# Patient Record
Sex: Male | Born: 1985 | Race: White | Hispanic: No | Marital: Single | Smoking: Current every day smoker
Health system: Southern US, Community
[De-identification: ages and names within clinical notes are randomized; demographics above are authoritative.]

## PROBLEM LIST (undated history)

## (undated) DIAGNOSIS — F309 Manic episode, unspecified: Secondary | ICD-10-CM

## (undated) DIAGNOSIS — R42 Dizziness and giddiness: Secondary | ICD-10-CM

## (undated) DIAGNOSIS — F329 Major depressive disorder, single episode, unspecified: Secondary | ICD-10-CM

## (undated) DIAGNOSIS — F32A Depression, unspecified: Secondary | ICD-10-CM

## (undated) DIAGNOSIS — F1310 Sedative, hypnotic or anxiolytic abuse, uncomplicated: Secondary | ICD-10-CM

## (undated) DIAGNOSIS — F419 Anxiety disorder, unspecified: Secondary | ICD-10-CM

## (undated) DIAGNOSIS — F1010 Alcohol abuse, uncomplicated: Secondary | ICD-10-CM

## (undated) DIAGNOSIS — I517 Cardiomegaly: Secondary | ICD-10-CM

## (undated) DIAGNOSIS — R490 Dysphonia: Secondary | ICD-10-CM

## (undated) HISTORY — DX: Cardiomegaly: I51.7

## (undated) HISTORY — DX: Manic episode, unspecified: F30.9

## (undated) HISTORY — DX: Anxiety disorder, unspecified: F41.9

## (undated) HISTORY — DX: Depression, unspecified: F32.A

## (undated) HISTORY — DX: Dysphonia: R49.0

## (undated) HISTORY — PX: NO PAST SURGERIES: SHX2092

## (undated) HISTORY — DX: Sedative, hypnotic or anxiolytic abuse, uncomplicated: F13.10

## (undated) HISTORY — DX: Major depressive disorder, single episode, unspecified: F32.9

## (undated) HISTORY — DX: Dizziness and giddiness: R42

## (undated) HISTORY — DX: Alcohol abuse, uncomplicated: F10.10

---
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
## 2004-11-20 ENCOUNTER — Ambulatory Visit: Payer: Self-pay | Admitting: Family Medicine

## 2005-09-30 ENCOUNTER — Ambulatory Visit: Payer: Self-pay | Admitting: Family Medicine

## 2005-11-23 ENCOUNTER — Ambulatory Visit: Payer: Self-pay | Admitting: Family Medicine

## 2010-04-26 ENCOUNTER — Emergency Department (HOSPITAL_COMMUNITY): Admission: EM | Admit: 2010-04-26 | Discharge: 2010-04-26 | Payer: Self-pay | Admitting: Family Medicine

## 2010-06-08 ENCOUNTER — Emergency Department (HOSPITAL_COMMUNITY): Admission: EM | Admit: 2010-06-08 | Discharge: 2010-06-08 | Payer: Self-pay | Admitting: Family Medicine

## 2010-10-08 LAB — CULTURE, ROUTINE-ABSCESS

## 2010-12-12 NOTE — H&P (Signed)
Curtis Nelson, Curtis Nelson                       ACCOUNT NO.:  [id number]   MEDICAL RECORD NO.:  [id number]                   PATIENT TYPE:  INP   LOCATION:  0348                                 FACILITY:  [facility]   PHYSICIAN:  Titus Dubin. Alwyn Ren, M.D. [facility]         DATE OF BIRTH:  [birth year]   DATE OF ADMISSION:  09/18/2002  DATE OF DISCHARGE:                                HISTORY & PHYSICAL   HISTORY OF PRESENT ILLNESS:  The patient is an 25 year old white male  admitted for observation status because of elevated acetaminophen levels in  the context of depression.   Apparently he had altercations with his significant-other girlfriend who  birthed his child three months ago.  Apparently he has been denied  visitation rights.  He states he was not trying to commit suicide but was  simply trying to get attention.   He has never been hospitalized but has been treated in the [facility] setting several years ago.   He had not been taking medications but has been using recreational drugs  including marijuana.  He also smokes one-half pack per day.  He denies  alcohol intake.   Dr. Milinda Antis had given him samples of Paxil XR 12.5 and Paxil XR 25 mg.  He  only took one or two pills according to his mother and, because it was not  an instant response, did not take the other samples.  Last night he took 28  of his Paxil XR 25 mg pills and two of the 12.5 pills.  He categorically  denies taking any Tylenol despite the elevated levels.   SOCIAL HISTORY:  He dropped out of high school two years ago.  He has held  no steady job during that period of time.  He has no long-term goals.  He  states he does plan to help support his child.   FAMILY HISTORY:  Positive for hypertension in both parents.  Mother had skin  cancer.  Father has had a history of alcoholism.  Maternal grandfather had  metastatic cancer, probably of lung primary.  His maternal aunt had mitral  valve  prolapse.  There is no family history of diabetes or stroke.   REVIEW OF SYSTEMS:  He states he has been depressed for two to three weeks.  He denies any other symptoms in reference to review of systems.   PHYSICAL EXAMINATION:  VITAL SIGNS:  Blood pressure has been as high as  162/76, O2 saturations are 98% on room air, pulse is 65 and regular,  respiratory rate is 20.  He is afebrile.  HEENT:  Pupils are dilated but responsive to light.  Dental hygiene is fair  to good.  He has no lymphadenopathy about the head, neck, or axilla.  Thyroid is normal to palpation.  HEART:  He has a grade 1 systolic murmur at the apex.  CHEST:  Clear to auscultation, with no increased work of  breathing.  ABDOMEN:  Flat and well muscled.  Without organomegaly or masses.  EXTREMITIES:  All pulses are intact.  There is no edema.  He has full range  of motion of extremities.  There is a tattoo resembling a marijuana leaf on  the left medial ankle and his initials on his right deltoid.  NEUROLOGIC:  Other than lethargy, there are no neurologic or  neuropsychiatric symptoms except for his obvious depression.   LABORATORY DATA:  Acetaminophen level is 223.  Drug screen was positive for  marijuana and amphetamines.  He denied taking any other medications except  for the Paxil and marijuana.  PT was 13 with an INR of 0.9.   PLAN:  He is now admitted with overdose which apparently was a suicide  gesture rather than a suicide attempt in the setting of depression.  The  excess acetaminophen level warrants continuation of the Mucomyst protocol.  The pharmacy will be consulted to try to discern whether indeed he did take  Tylenol or whether the acetaminophen level is representative of excess  ingestion.  The latter is more likely as he does not mention amphetamines  and yet they are positive on the drug screen as well.   Once there is deemed to be no hepatic or coagulopathy risk, consult by  psychiatry with  subsequent follow-up.  Long-term follow-up will be done by  Dr. Milinda Antis as an outpatient.                                               Titus Dubin. Alwyn Ren, M.D. [facility]    WFH/MEDQ  D:  09/18/2002  T:  09/18/2002  Job:  [id number]   cc:   Marne A. Milinda Antis, M.D. [facility]

## 2010-12-12 NOTE — Discharge Summary (Signed)
Curtis Nelson, Curtis Nelson                       ACCOUNT NO.:  [id number]   MEDICAL RECORD NO.:  [id number]                   PATIENT TYPE:  INP   LOCATION:  0348                                 FACILITY:  [facility]   PHYSICIAN:  Rene Paci, M.D. [facility]          DATE OF BIRTH:  [birth year]   DATE OF ADMISSION:  09/18/2002  DATE OF DISCHARGE:                                 DISCHARGE SUMMARY   DISCHARGE DIAGNOSES:  1. Intentional drug overdose with toxic acetaminophen levels, status post     Mucomyst protocol, resolved.  2. Suicide attempt.  3. Abnormal thyroid function tests, question early hyperthyroidism.   DISCHARGE MEDICATIONS:  As  prior to  admission and include Paxil XR 12.5 mg  p.o. b.i.d.   FOLLOW UP:  As per ACT team to be arranged with [facility]. He  is also to call  his primary care physician, Dr. Vinnie Langton, for repeat  TFTs in approximately 6 to 8 weeks after discharge to monitor for early  hyperthyroidism.   CONDITION ON DISCHARGE:  Medically clear and improved.   DISPOSITION:  As to be decided by ACT team, either to home or inpatient  Behavioral Health.   HOSPITAL COURSE BY PROBLEM:  PROBLEM #1, INTENTIONAL OVERDOSE WITH SUICIDE  ATTEMPT:  The patient is an 25 year old white gentleman admitted for  observation after an intentional drug overdose. He reports that he had been  fighting with his girlfriend, mother of his 27-year-old daughter, and was  trying to get attention from her and his mother. He reported taking 28 of  his Paxil tablets and unsure what other medications he might have taken. He  specifically denied taking Tylenol, however, his acetaminophen level was  greater than 220 on admission. His urine drug screen was also positive for  marijuana as well as amphetamines. The patient could not recall having  taken any marijuana or amphetamines either.   He was admitted to a telemetry bed and begun on a Mucomyst protocol for  acetaminophen  ingestion. His coags and LFTs were normal at the time of  admission. Then 24 hours later on a repeat examination his LFTs remained  normal as did his coags. His acetaminophen level had resolved to less than  10.   We are still awaiting an ACT team evaluation to verify his mental stability  at this time. Per ACT team evaluation he will either be discharged to home  or [facility] for further inpatient treatment. At this time he  is medically stable and clear for discharge.   PROBLEM #2, ABNORMAL THYROID FUNCTION TESTS: It is unclear why TFTs were  checked during this hospitalization as the patient has no clear history of  previous thyroid problems. However, his TSH was high normal at 0.93, but his  free T4 was elevated twice normal at 2.38. This could represent early  hypothyroidism with an as of  yet unsuppressed TSH. However, the patient is asymptomatic  of any  hyperthyroid disease including normotensive and a pulse of 69. He denies any  feelings of anxiety. Accordingly we will not initiate treatment for  hyperthyroidism at this time and recommend followup of his TFTs with his  primary care physician in 6 to 8 weeks.                                                Rene Paci, M.D. [facility]    VL/MEDQ  D:  09/19/2002  T:  09/19/2002  Job:  [id number]

## 2012-06-21 ENCOUNTER — Ambulatory Visit: Payer: Self-pay | Admitting: Family Medicine

## 2012-06-21 VITALS — BP 130/70 | HR 80 | Temp 97.6°F | Resp 18 | Ht 77.0 in | Wt 193.0 lb

## 2012-06-21 DIAGNOSIS — L03119 Cellulitis of unspecified part of limb: Secondary | ICD-10-CM

## 2012-06-21 DIAGNOSIS — L02519 Cutaneous abscess of unspecified hand: Secondary | ICD-10-CM

## 2012-06-21 DIAGNOSIS — L853 Xerosis cutis: Secondary | ICD-10-CM

## 2012-06-21 DIAGNOSIS — L738 Other specified follicular disorders: Secondary | ICD-10-CM

## 2012-06-21 MED ORDER — DOXYCYCLINE HYCLATE 100 MG PO TABS: 100.0000 mg | ORAL_TABLET | Freq: Two times a day (BID) | ORAL | Status: DC

## 2012-06-21 MED ORDER — MUPIROCIN 2 % EX OINT: TOPICAL_OINTMENT | Freq: Three times a day (TID) | CUTANEOUS | Status: DC

## 2012-06-21 NOTE — Progress Notes (Signed)
   Subjective:    Patient ID: Curtis Nelson, male    DOB: 1985-11-27, 26 y.o.   MRN: [id number]  HPI Curtis Nelson is a 26 y.o. male  R 3rd finger swollen and warm - noticed this am, but sore underneath yesterday. Cracking in hands frequently, worse in winter.  Occasional lotion at night.  No fever.    Tx: neosporin.   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   Review of Systems  Constitutional: Negative for fever and chills.  Skin: Positive for color change and rash.       Objective:   Physical Exam  Constitutional: He is oriented to person, place, and time. He appears well-developed and well-nourished.  Pulmonary/Chest: Effort normal.  Musculoskeletal:       Right hand: He exhibits decreased range of motion (slight decreased ext at R3rd MCP with sts, but full ext and strength. ). He exhibits no bony tenderness.       Hands:      Diffuse dry skin with areas of cracked skin - dorsal hands bilaterally - especially over extensor surfaces of knuckles.   Neurological: He is alert and oriented to person, place, and time.          Assessment & Plan:  Curtis Nelson is a 26 y.o. male 1. Cellulitis of hand  doxycycline (VIBRA-TABS) 100 MG tablet, mupirocin ointment (BACTROBAN) 2 %  2. Dry skin     Discussed, demonstrated lotion application technique,  Including how to apply to back of hand and knuckles and flexor creases without greasy feel on fingertips.   Doxycycline and bactroban to r 3rd finger for secondary cellulitis. rtc precautions and care as below.  Understanding expressed.   Patient Instructions  Start antibiotic twice per day.  Also apply the antibiotic three times per day.  Warm compresses to finger at least three times per day, and recheck if not improving in next few days. Return to the clinic or go to the nearest emergency room if any of your symptoms worsen or new symptoms occur.  For the dry skin - apply lotion after washing hands.  Gloves if needed in  dusty/dry conditions. Aveeno, eucerin, lubriderm, or vaseline are all good options. Apply this multiple times per day.

## 2012-06-21 NOTE — Patient Instructions (Signed)
Start antibiotic twice per day.  Also apply the antibiotic three times per day.  Warm compresses to finger at least three times per day, and recheck if not improving in next few days. Return to the clinic or go to the nearest emergency room if any of your symptoms worsen or new symptoms occur.  For the dry skin - apply lotion after washing hands.  Gloves if needed in dusty/dry conditions. Aveeno, eucerin, lubriderm, or vaseline are all good options. Apply this multiple times per day.

## 2012-09-28 ENCOUNTER — Ambulatory Visit: Payer: Self-pay | Admitting: Psychology

## 2013-10-24 ENCOUNTER — Ambulatory Visit: Payer: 59

## 2013-10-24 ENCOUNTER — Ambulatory Visit (HOSPITAL_COMMUNITY)
Admission: RE | Admit: 2013-10-24 | Discharge: 2013-10-24 | Disposition: A | Discharge status: Home / Self Care | Payer: 59 | Source: Ambulatory Visit | Attending: Emergency Medicine | Admitting: Emergency Medicine

## 2013-10-24 ENCOUNTER — Ambulatory Visit: Payer: 59 | Admitting: Emergency Medicine

## 2013-10-24 VITALS — BP 132/64 | HR 72 | Temp 97.3°F | Resp 16 | Ht 76.0 in | Wt 203.6 lb

## 2013-10-24 DIAGNOSIS — R3129 Other microscopic hematuria: Secondary | ICD-10-CM | POA: Insufficient documentation

## 2013-10-24 DIAGNOSIS — R109 Unspecified abdominal pain: Secondary | ICD-10-CM

## 2013-10-24 DIAGNOSIS — R319 Hematuria, unspecified: Secondary | ICD-10-CM

## 2013-10-24 DIAGNOSIS — R1032 Left lower quadrant pain: Secondary | ICD-10-CM | POA: Insufficient documentation

## 2013-10-24 DIAGNOSIS — K5730 Diverticulosis of large intestine without perforation or abscess without bleeding: Secondary | ICD-10-CM | POA: Insufficient documentation

## 2013-10-24 LAB — POCT UA - MICROSCOPIC ONLY
BACTERIA, U MICROSCOPIC: NEGATIVE
CRYSTALS, UR, HPF, POC: NEGATIVE
Casts, Ur, LPF, POC: NEGATIVE
Yeast, UA: NEGATIVE

## 2013-10-24 LAB — POCT CBC
Granulocyte percent: 73.7 %G (ref 37–80)
HCT, POC: 43.5 % (ref 43.5–53.7)
Hemoglobin: 13.8 g/dL — AB (ref 14.1–18.1)
Lymph, poc: 1.7 (ref 0.6–3.4)
MCH: 30.9 pg (ref 27–31.2)
MCHC: 31.7 g/dL — AB (ref 31.8–35.4)
MCV: 97.5 fL — AB (ref 80–97)
MID (CBC): 0.5 (ref 0–0.9)
MPV: 9.4 fL (ref 0–99.8)
PLATELET COUNT, POC: 237 10*3/uL (ref 142–424)
POC Granulocyte: 6 (ref 2–6.9)
POC LYMPH %: 20.5 % (ref 10–50)
POC MID %: 5.8 % (ref 0–12)
RBC: 4.46 M/uL — AB (ref 4.69–6.13)
RDW, POC: 13.4 %
WBC: 8.1 10*3/uL (ref 4.6–10.2)

## 2013-10-24 LAB — POCT URINALYSIS DIPSTICK
BILIRUBIN UA: NEGATIVE
Glucose, UA: NEGATIVE
KETONES UA: NEGATIVE
LEUKOCYTES UA: NEGATIVE
Nitrite, UA: NEGATIVE
Urobilinogen, UA: 0.2
pH, UA: 6

## 2013-10-24 MED ORDER — IOHEXOL 300 MG/ML  SOLN
100.0000 mL | Freq: Once | INTRAMUSCULAR | Status: AC | PRN
  Administered 2013-10-24: 100 mL via INTRAVENOUS

## 2013-10-24 MED ORDER — IOHEXOL 300 MG/ML  SOLN
50.0000 mL | Freq: Once | INTRAMUSCULAR | Status: AC | PRN
  Administered 2013-10-24: 50 mL via ORAL

## 2013-10-24 NOTE — Patient Instructions (Signed)
Go to [facility] and register in Radiology

## 2013-10-24 NOTE — Progress Notes (Signed)
[facility] [address], [place] [id number] [phone]  Date:  10/24/2013   Name:  Curtis Nelson   DOB:  [birth year]   MRN:  [id number]  PCP:  No primary provider on file.    Chief Complaint: Abdominal Pain   History of Present Illness:  Curtis Nelson is a 28 y.o. very pleasant male patient who presents with the following:  Awoke with left periumbilical pain this morning.  Has not moved.  Constant and worsening.  No nausea or vomiting.  No stool change.  Anorectic.  No fever or chills.  No history of abdominal surgery.  No dysuria, urgency or frequency.  No blood in urine   Worse with bending over.  Uncomfortable while driving and hit bumps.  Less when lays still.  No improvement with over the counter medications or other home remedies. Denies other complaint or health concern today.   There are no active problems to display for this patient.   History reviewed. No pertinent past medical history.  History reviewed. No pertinent past surgical history.  History  Substance Use Topics  . Smoking status: Current Every Day Smoker  . Smokeless tobacco: Not on file  . Alcohol Use: Yes     Comment: rare    Family History  Problem Relation Age of Onset  . Heart disease Father     No Known Allergies  Medication list has been reviewed and updated.  Current Outpatient Prescriptions on File Prior to Visit  Medication Sig Dispense Refill  . doxycycline (VIBRA-TABS) 100 MG tablet Take 1 tablet (100 mg total) by mouth 2 (two) times daily.  20 tablet  0  . mupirocin ointment (BACTROBAN) 2 % Apply topically 3 (three) times daily.  22 g  0   No current facility-administered medications on file prior to visit.    Review of Systems:  As per HPI, otherwise negative.    Physical Examination: Filed Vitals:   10/24/13 1342  BP: 132/64  Pulse: 72  Temp: 97.3 F (36.3 C)  Resp: 16   Filed Vitals:   10/24/13 1342  Height: 6\' 4"  (1.93 m)  Weight:  203 lb 9.6 oz (92.352 kg)   Body mass index is 24.79 kg/(m^2). Ideal Body Weight: Weight in (lb) to have BMI = 25: 205  GEN: WDWN, NAD, Non-toxic, A & O x 3 HEENT: Atraumatic, Normocephalic. Neck supple. No masses, No LAD. Ears and Nose: No external deformity. CV: RRR, No M/G/R. No JVD. No thrill. No extra heart sounds. PULM: CTA B, no wheezes, crackles, rhonchi. No retractions. No resp. distress. No accessory muscle use. ABD: S, tender in LUQ and RLQ, ND, +BS. No rebound. No HSM.  Questionable jar tenderness EXTR: No c/c/e NEURO Normal gait.  PSYCH: Normally interactive. Conversant. Not depressed or anxious appearing.  Calm demeanor.    Assessment and Plan: Abdominal pain Hematuria Not characteristic for stone nor appendicitis CT  Signed,  Phillips OdorJeffery Anderson, MD   [facility] reading (PRIMARY) by  Dr. Dareen PianoAnderson.  No obstruction or free air  Results for orders placed in visit on 10/24/13  POCT UA - MICROSCOPIC ONLY      Result Value Ref Range   WBC, Ur, HPF, POC 0-1     RBC, urine, microscopic 8-12     Bacteria, U Microscopic neg     Mucus, UA large     Epithelial cells, urine per micros 0-1     Crystals, Ur, HPF, POC neg  Casts, Ur, LPF, POC neg     Yeast, UA neg    POCT URINALYSIS DIPSTICK      Result Value Ref Range   Color, UA yellow     Clarity, UA clear     Glucose, UA neg     Bilirubin, UA neg     Ketones, UA neg     Spec Grav, UA >=1.030     Blood, UA tr-lysed     pH, UA 6.0     Protein, UA trace     Urobilinogen, UA 0.2     Nitrite, UA neg     Leukocytes, UA Negative    POCT CBC      Result Value Ref Range   WBC 8.1  4.6 - 10.2 K/uL   Lymph, poc 1.7  0.6 - 3.4   POC LYMPH PERCENT 20.5  10 - 50 %L   MID (cbc) 0.5  0 - 0.9   POC MID % 5.8  0 - 12 %M   POC Granulocyte 6.0  2 - 6.9   Granulocyte percent 73.7  37 - 80 %G   RBC 4.46 (*) 4.69 - 6.13 M/uL   Hemoglobin 13.8 (*) 14.1 - 18.1 g/dL   HCT, POC 16.1  09.6 - 53.7 %   MCV 97.5 (*) 80 - 97 fL   MCH,  POC 30.9  27 - 31.2 pg   MCHC 31.7 (*) 31.8 - 35.4 g/dL   RDW, POC 04.5     Platelet Count, POC 237  142 - 424 K/uL   MPV 9.4  0 - 99.8 fL   .

## 2014-09-22 IMAGING — CT CT ABD-PELV W/ CM
2 of 4 series · 17 of 46 positions shown, 19 images · IV contrast (OMNIPAQUE)
Comparison: DG ABDOMEN ACUTE 2V W/ 1V CHEST dated 10/24/2013; CT
ABD-PELV WO/W CM dated 10/15/2009

CLINICAL DATA: Abdominal pain localizing to the left lower quadrant
with microscopic hematuria.

EXAM:
CT ABDOMEN AND PELVIS WITH CONTRAST
TECHNIQUE: Multidetector CT imaging of the abdomen and pelvis was performed
using the standard protocol following bolus administration of
intravenous contrast.
CONTRAST:  50mL OMNIPAQUE IOHEXOL 300 MG/ML SOLN, 100mL OMNIPAQUE
IOHEXOL 300 MG/ML SOLN

[Series 2: rtn a/p with · axial · 0.88mm/px · z∈[+1396,+1811]mm · 14 of 91 slices shown, 16 images]
[im 4/91  soft-tissue]
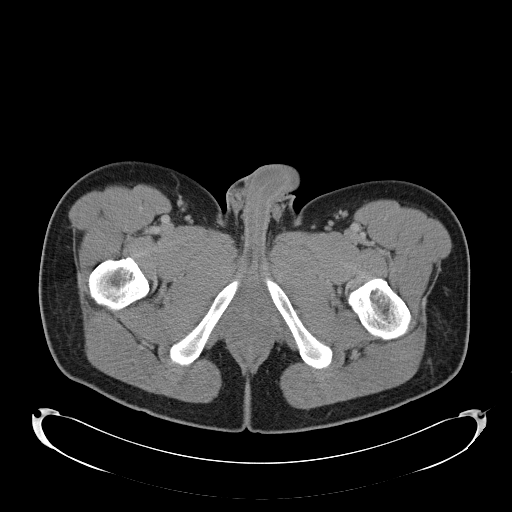
[im 4/91  bone]
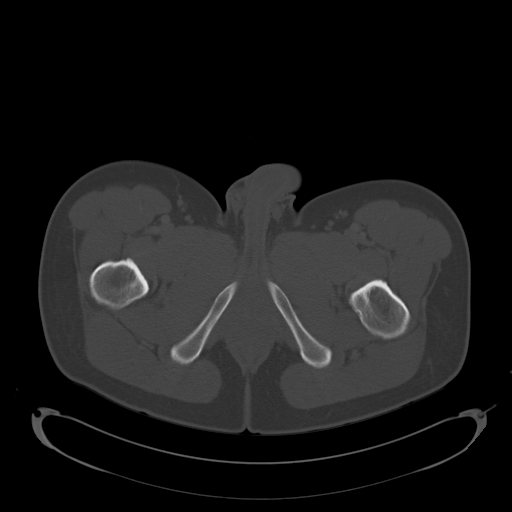
[im 11/91  soft-tissue]
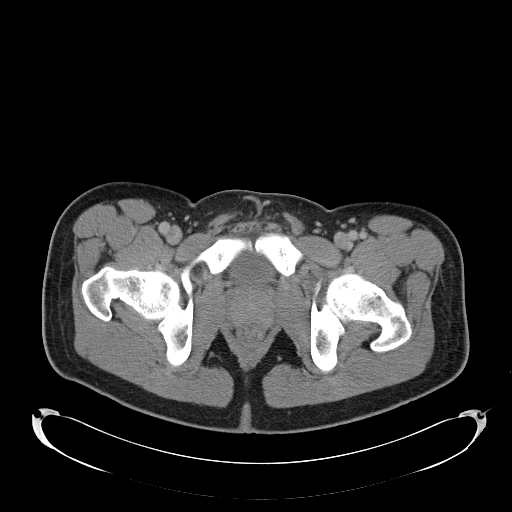
[im 19/91  soft-tissue]
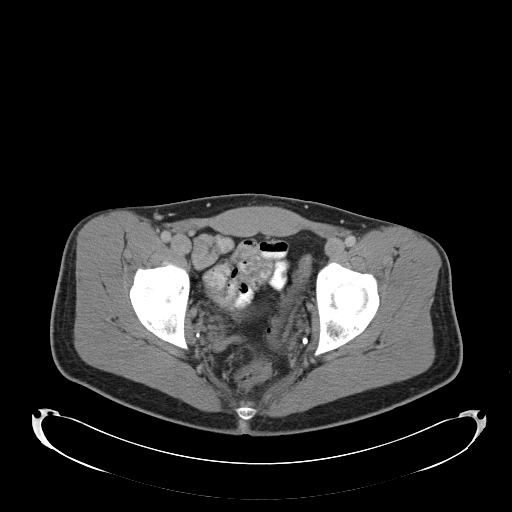
[im 26/91  soft-tissue]
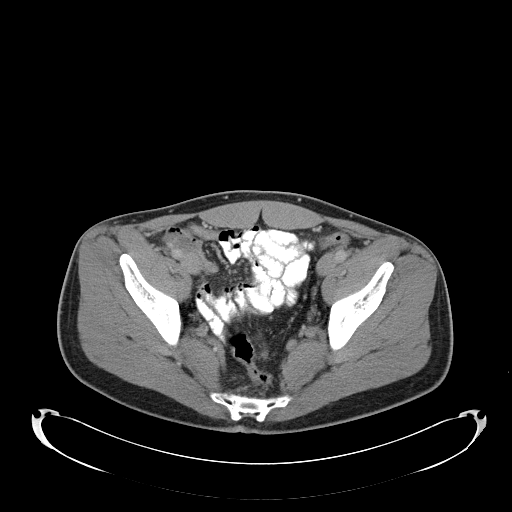
[im 29/91  soft-tissue]
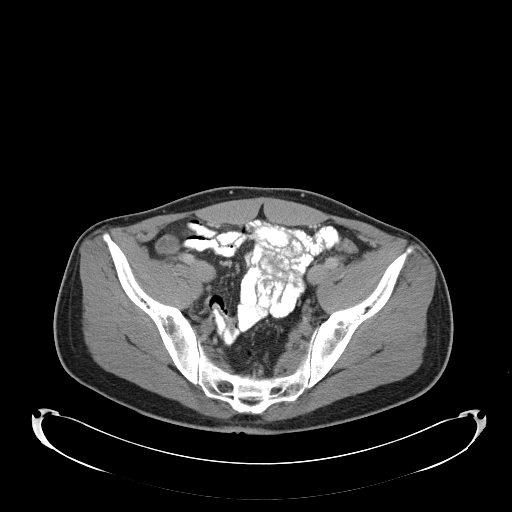
[im 37/91  soft-tissue]
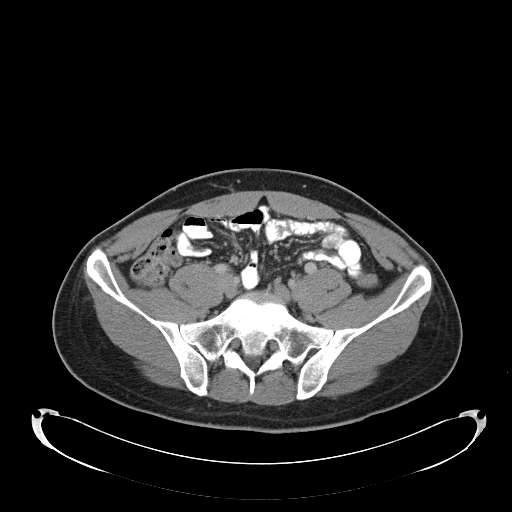
[im 44/91  soft-tissue]
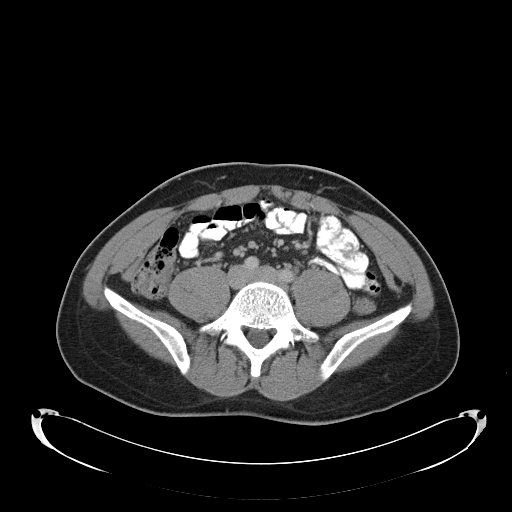
[im 47/91  soft-tissue]
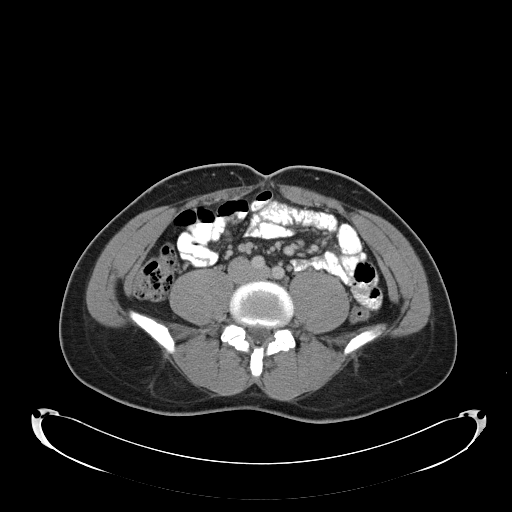
[im 55/91  soft-tissue]
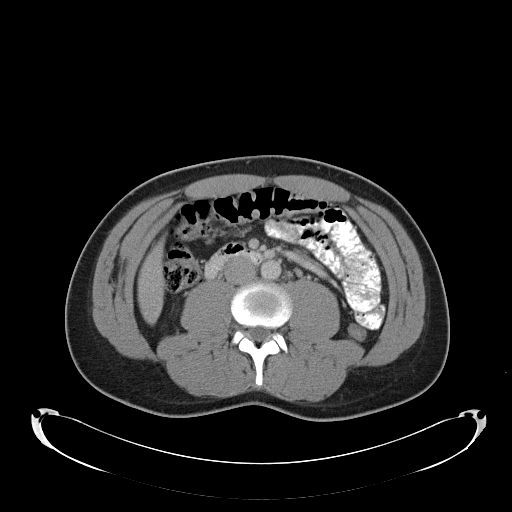
[im 55/91  bone]
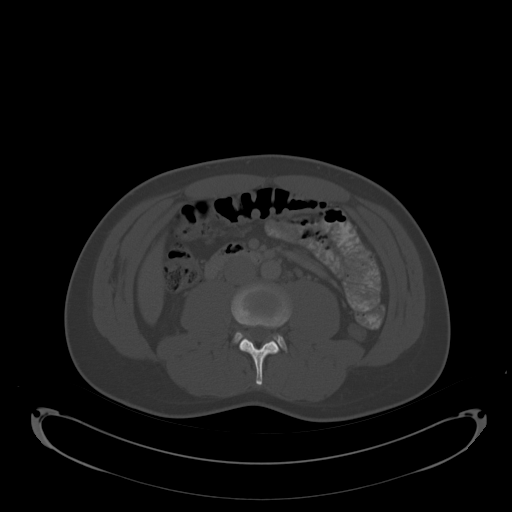
[im 62/91  soft-tissue]
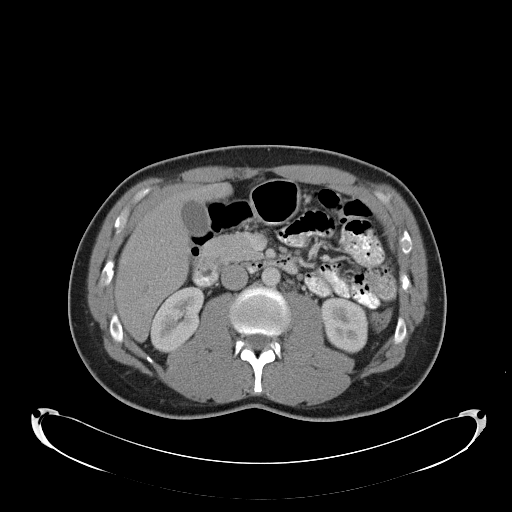
[im 69/91  soft-tissue]
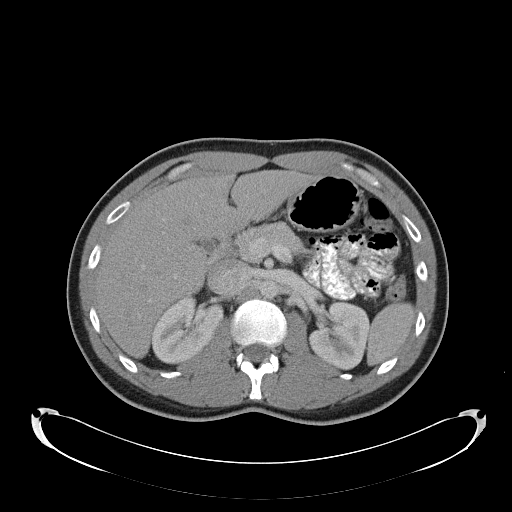
[im 73/91  soft-tissue]
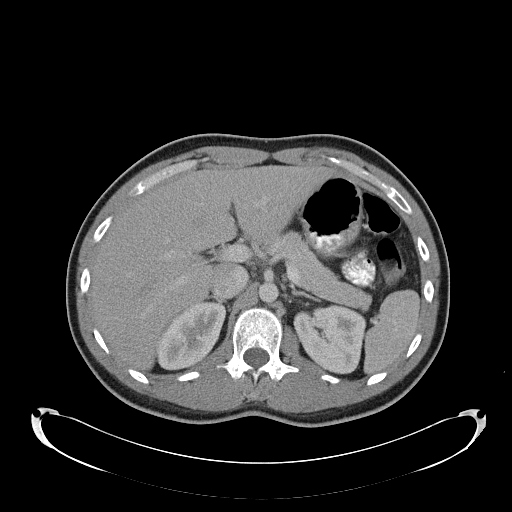
[im 80/91  soft-tissue]
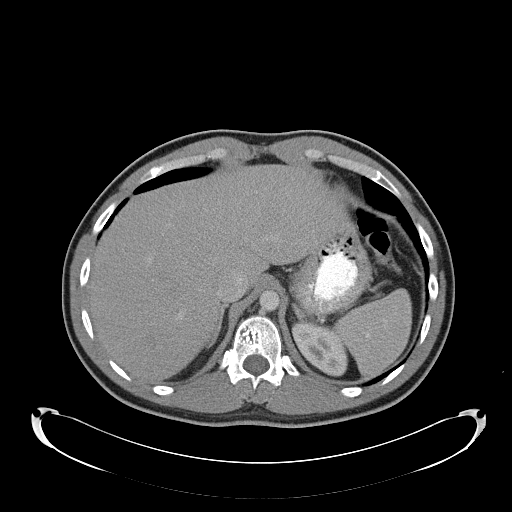
[im 87/91  soft-tissue]
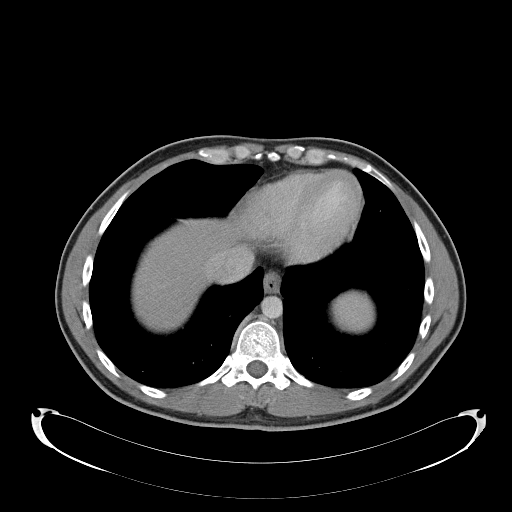

[Series 602: <mpr thick range> · coronal · 0.89mm/px · 3 of 78 slices shown]
[im 26/78  soft-tissue]
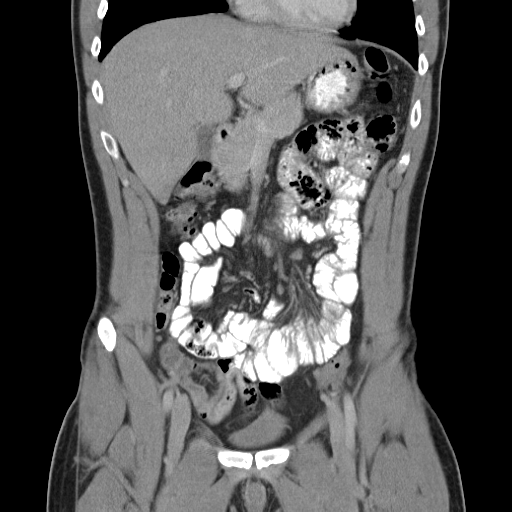
[im 35/78  soft-tissue]
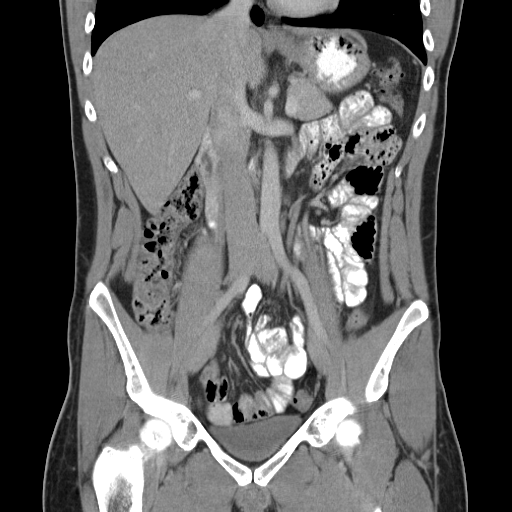
[im 43/78  soft-tissue]
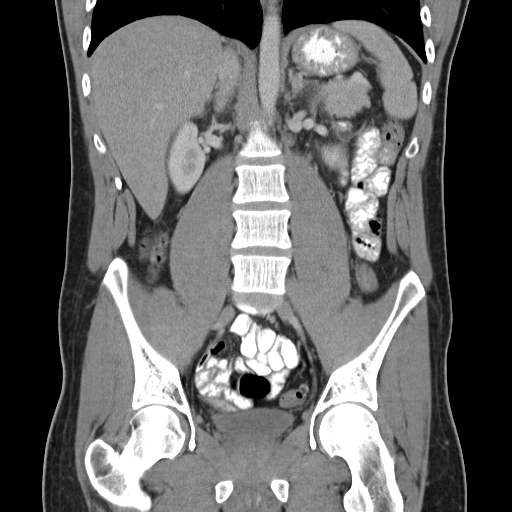

[17 of 46 positions shown; findings below may reference images not displayed]

FINDINGS: The kidneys, ureters and bladder are unremarkable with no evidence
of renal calculi, obstruction, ureteral calculi or bladder
abnormalities. The liver, gallbladder, pancreas, spleen, adrenal
glands and bowel are unremarkable. Mild diverticulosis of the
sigmoid colon noted without evidence of diverticulitis by CT.

No free fluid or abnormal fluid collections are seen. There is no
evidence of lymphadenopathy or focal mass. No vascular abnormalities
are identified. No hernias are seen. Bony structures are within
normal limits.
IMPRESSION: No acute findings in the abdomen or pelvis. No evidence of renal
calculi or other abnormality to explain microscopic hematuria.

## 2014-09-22 IMAGING — CR DG ABDOMEN ACUTE W/ 1V CHEST
3 series · 3 of 3 positions shown · non-contrast
Comparison: None.

CLINICAL DATA: Abdomen pain

EXAM:
ACUTE ABDOMEN SERIES (ABDOMEN 2 VIEW & CHEST 1 VIEW)

[AP (1 of 2)]
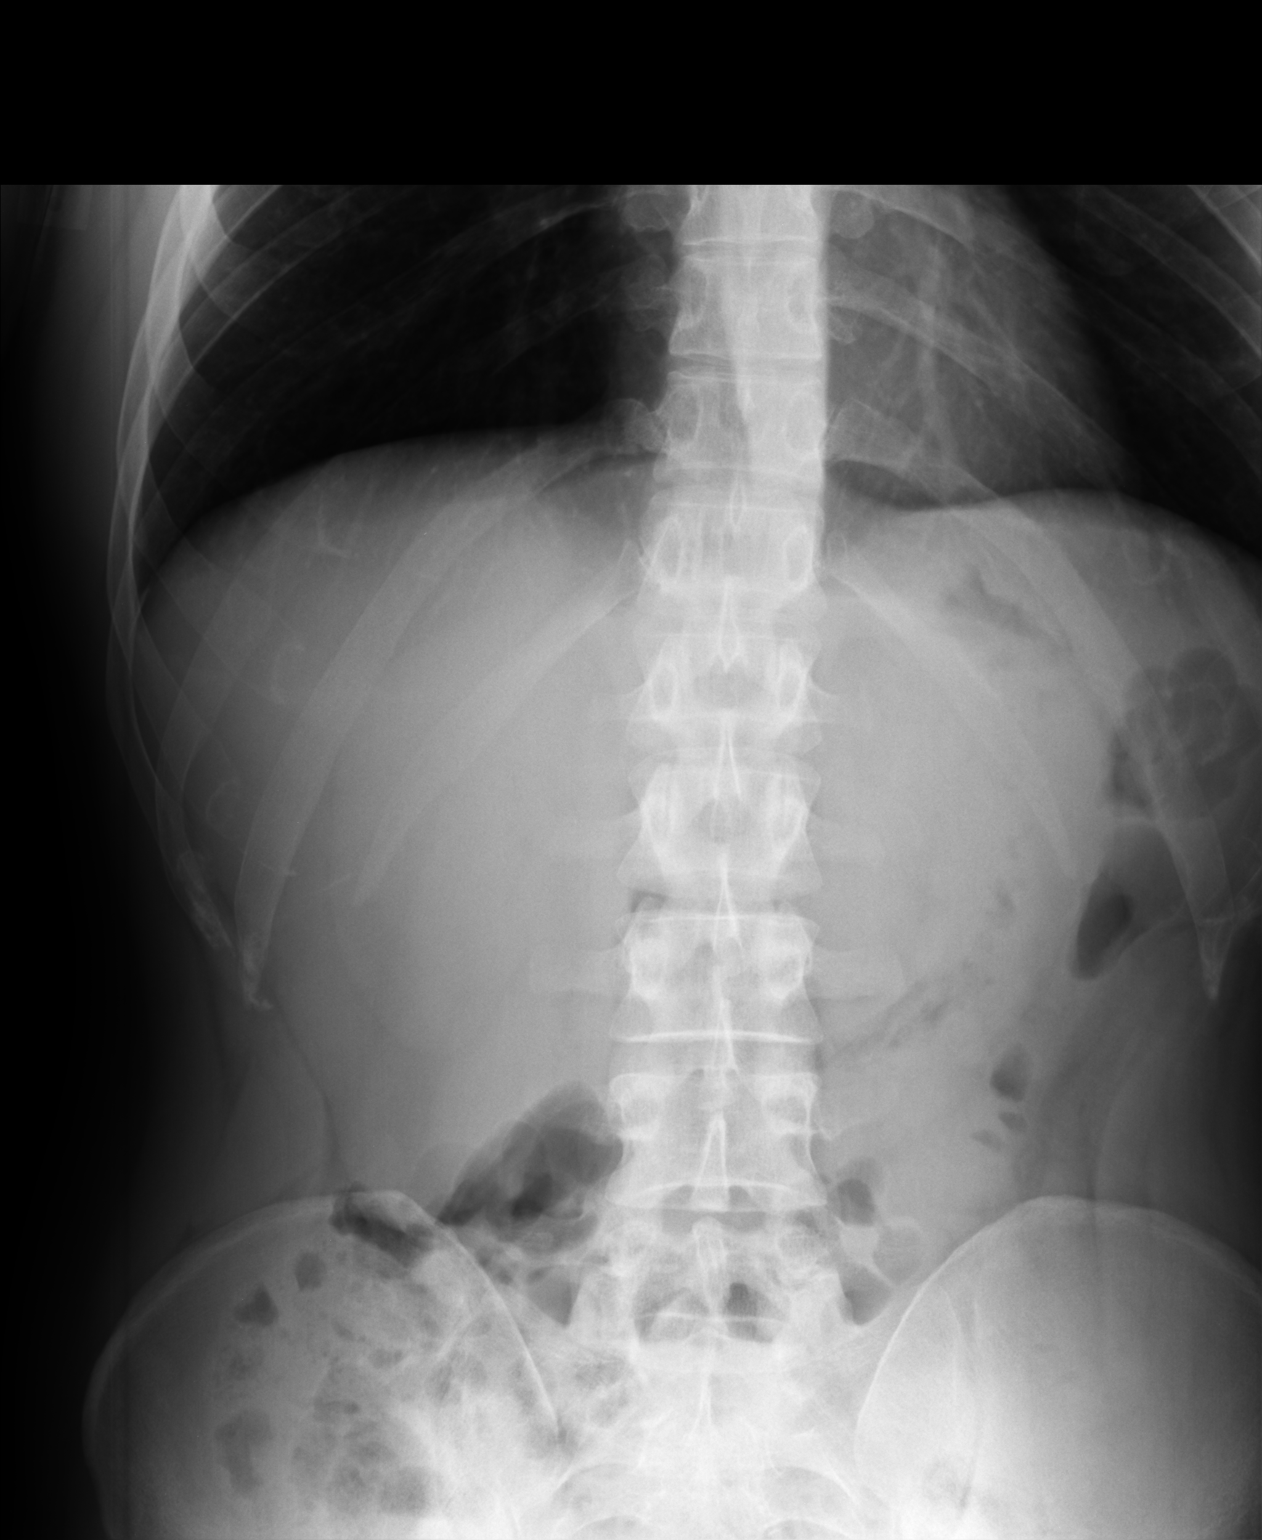

[AP (2 of 2)]
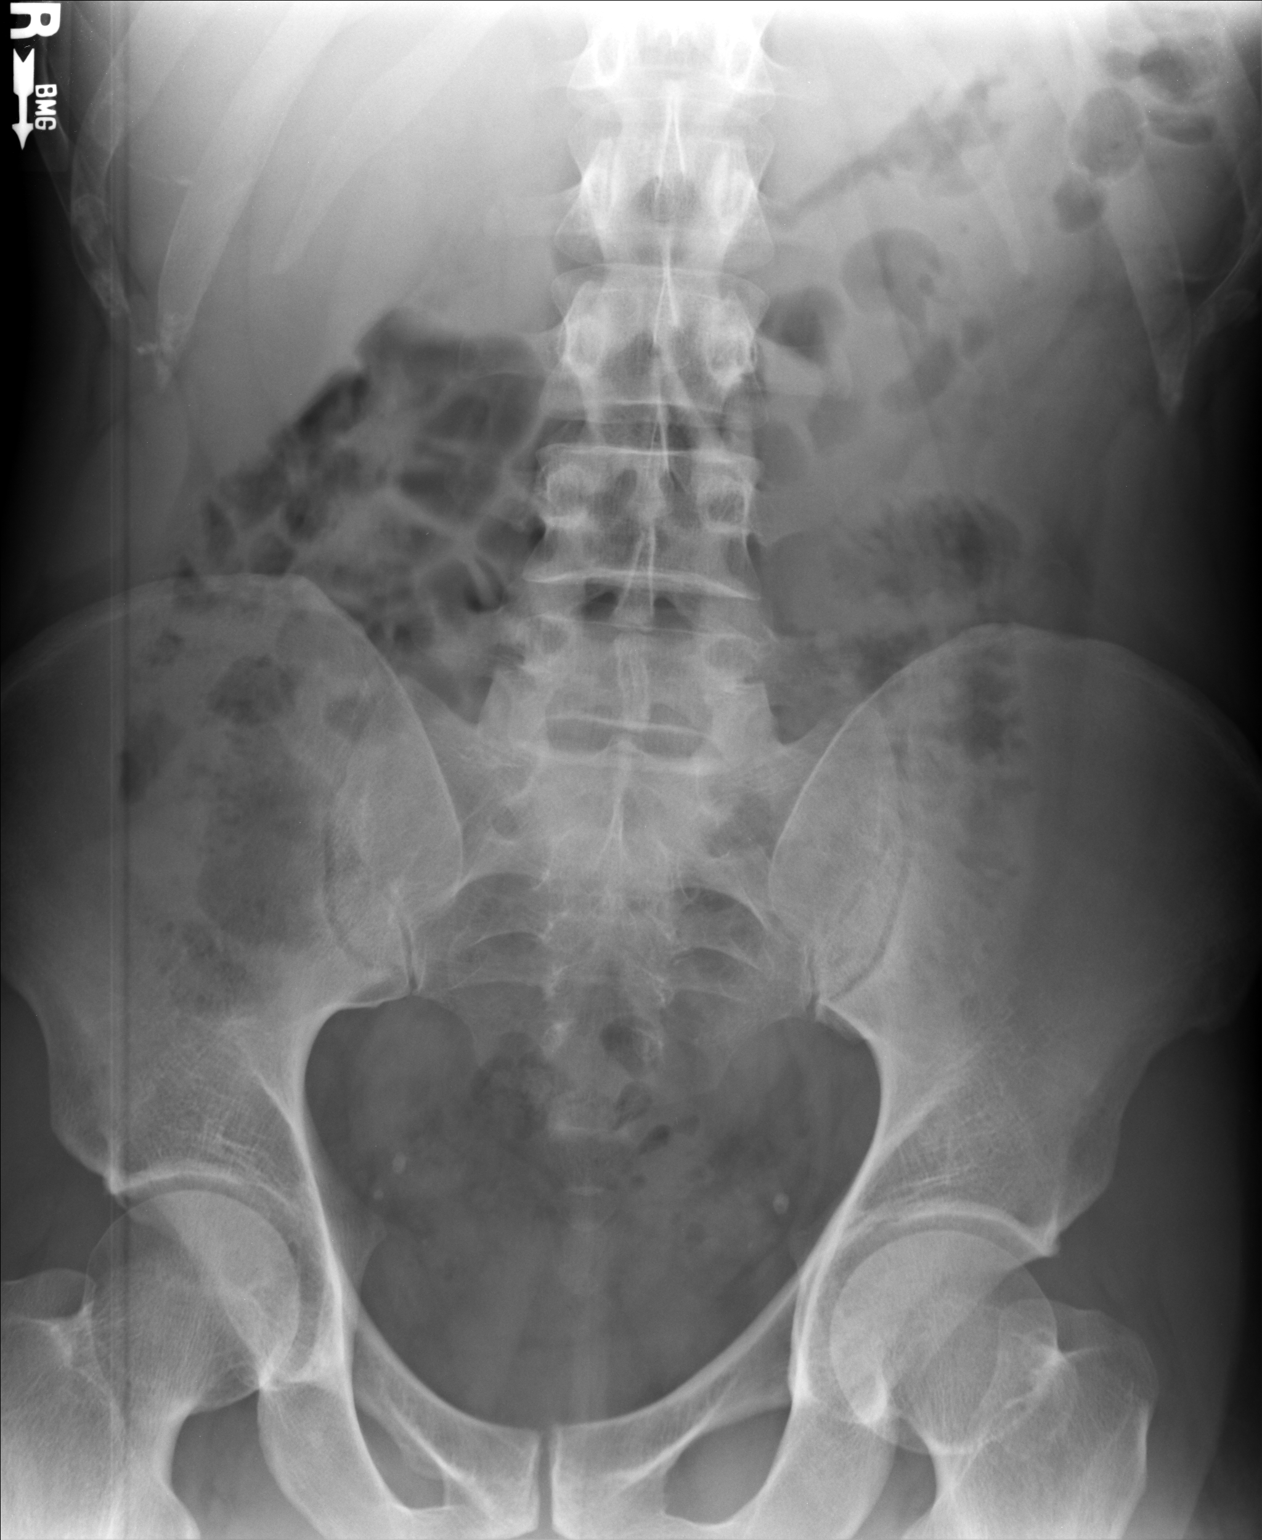

[PA]
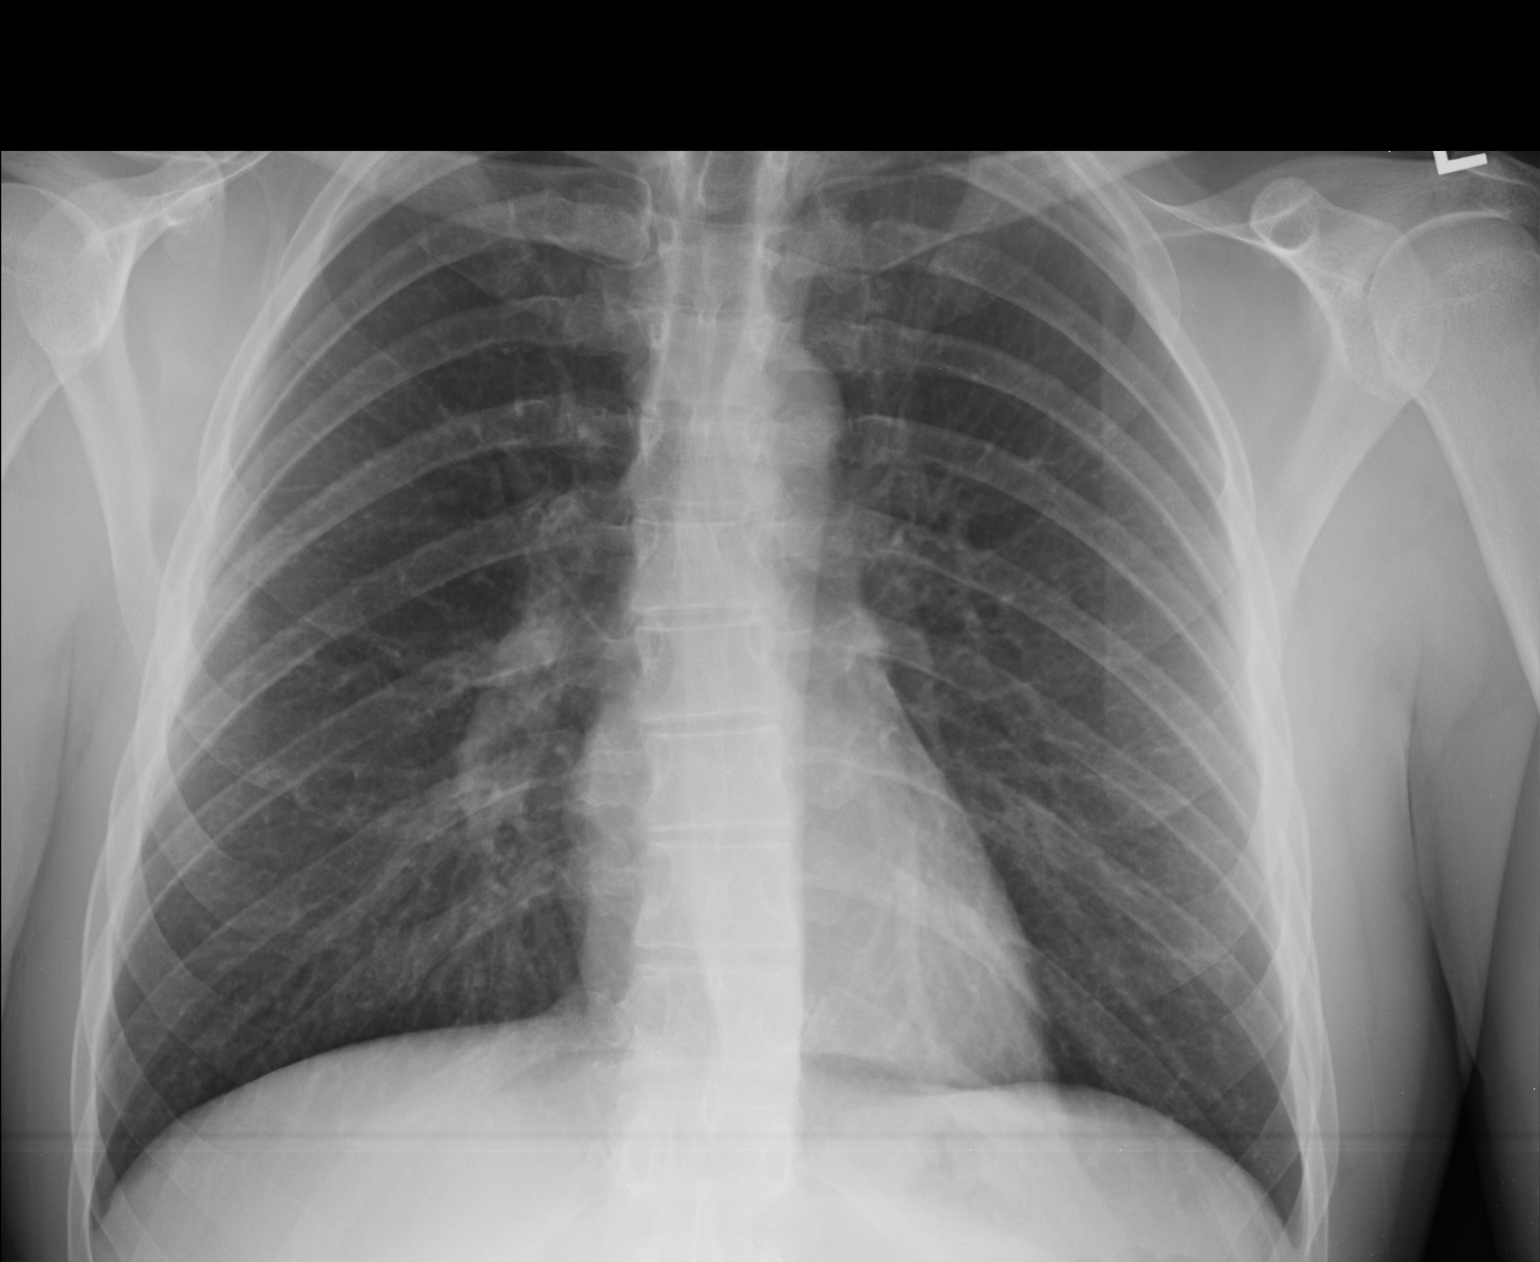

[3 of 3 positions shown; findings below may reference images not displayed]

FINDINGS: There is no evidence of dilated bowel loops or free intraperitoneal
air. Pelvic phleboliths are noted. Heart size and mediastinal
contours are within normal limits. Both lungs are clear.
IMPRESSION: Negative abdominal radiographs.  No acute cardiopulmonary disease.

## 2015-02-22 ENCOUNTER — Ambulatory Visit (INDEPENDENT_AMBULATORY_CARE_PROVIDER_SITE_OTHER): Payer: Managed Care, Other (non HMO) | Admitting: Family Medicine

## 2015-02-22 VITALS — BP 130/84 | HR 82 | Temp 97.8°F | Resp 16 | Ht 75.5 in | Wt 198.0 lb

## 2015-02-22 DIAGNOSIS — L03114 Cellulitis of left upper limb: Secondary | ICD-10-CM

## 2015-02-22 DIAGNOSIS — F309 Manic episode, unspecified: Secondary | ICD-10-CM | POA: Diagnosis not present

## 2015-02-22 DIAGNOSIS — X838XXA Intentional self-harm by other specified means, initial encounter: Secondary | ICD-10-CM

## 2015-02-22 DIAGNOSIS — Z113 Encounter for screening for infections with a predominantly sexual mode of transmission: Secondary | ICD-10-CM | POA: Diagnosis not present

## 2015-02-22 DIAGNOSIS — R200 Anesthesia of skin: Secondary | ICD-10-CM

## 2015-02-22 DIAGNOSIS — F430 Acute stress reaction: Secondary | ICD-10-CM

## 2015-02-22 DIAGNOSIS — R202 Paresthesia of skin: Secondary | ICD-10-CM

## 2015-02-22 LAB — CBC
HEMATOCRIT: 40.7 % (ref 39.0–52.0)
HEMOGLOBIN: 14.3 g/dL (ref 13.0–17.0)
MCH: 32.2 pg (ref 26.0–34.0)
MCHC: 35.1 g/dL (ref 30.0–36.0)
MCV: 91.7 fL (ref 78.0–100.0)
MPV: 10.4 fL (ref 8.6–12.4)
PLATELETS: 241 10*3/uL (ref 150–400)
RBC: 4.44 MIL/uL (ref 4.22–5.81)
RDW: 13.5 % (ref 11.5–15.5)
WBC: 6.1 10*3/uL (ref 4.0–10.5)

## 2015-02-22 LAB — TSH: TSH: 0.325 u[IU]/mL — AB (ref 0.350–4.500)

## 2015-02-22 MED ORDER — OLANZAPINE 5 MG PO TABS: 5.0000 mg | ORAL_TABLET | Freq: Every day | ORAL | Status: DC

## 2015-02-22 MED ORDER — CEPHALEXIN 500 MG PO CAPS: 500.0000 mg | ORAL_CAPSULE | Freq: Three times a day (TID) | ORAL | Status: DC

## 2015-02-22 NOTE — Patient Instructions (Signed)
Please take the keflex as directed for your left arm infection.   If this is not getting better please let me know!  Wash the burns 1-2x daily and keep covered if you like  Please be sure to see your provider at the psychiatrists office next week.  For the time being we are going to have you use zyprexa at bedtime.  I think this will help you sleep and feel more calm  However it may cause you to be a little drowsy during the day.
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
## 2015-02-23 LAB — COMPREHENSIVE METABOLIC PANEL
ALK PHOS: 50 U/L (ref 40–115)
ALT: 20 U/L (ref 9–46)
AST: 20 U/L (ref 10–40)
Albumin: 4.8 g/dL (ref 3.6–5.1)
BILIRUBIN TOTAL: 0.7 mg/dL (ref 0.2–1.2)
BUN: 15 mg/dL (ref 7–25)
CALCIUM: 9.6 mg/dL (ref 8.6–10.3)
CHLORIDE: 104 mmol/L (ref 98–110)
CO2: 24 mmol/L (ref 20–31)
CREATININE: 0.87 mg/dL (ref 0.60–1.35)
Glucose, Bld: 98 mg/dL (ref 65–99)
POTASSIUM: 4.2 mmol/L (ref 3.5–5.3)
Sodium: 135 mmol/L (ref 135–146)
TOTAL PROTEIN: 7.1 g/dL (ref 6.1–8.1)

## 2015-02-23 LAB — HIV ANTIBODY (ROUTINE TESTING W REFLEX): HIV: NONREACTIVE

## 2015-02-23 LAB — RPR

## 2015-02-23 LAB — GC/CHLAMYDIA PROBE AMP
CT Probe RNA: NEGATIVE
GC Probe RNA: NEGATIVE

## 2015-02-23 LAB — HEPATITIS B SURFACE ANTIGEN: HEP B S AG: NEGATIVE

## 2015-02-23 LAB — HEPATITIS C ANTIBODY: HCV Ab: NEGATIVE

## 2015-02-25 ENCOUNTER — Encounter: Payer: Self-pay | Admitting: Family Medicine

## 2015-02-25 NOTE — Addendum Note (Signed)
Addended by: Abbe Amsterdam C on: 02/25/2015 09:34 AM   Modules accepted: Orders

## 2015-03-04 ENCOUNTER — Telehealth: Payer: Self-pay

## 2015-03-04 NOTE — Telephone Encounter (Signed)
Patient called again to follow up on this. He states that his employer is "demanding" an answer about him being out of work. Please call.

## 2015-03-04 NOTE — Telephone Encounter (Signed)
Patient's mother called stating that her son is not improved enough to return to work. She wants to know if Dr. Patsy Lager will be able to extend his work note or if he needs to RTC. Please call patient back at [phone].

## 2015-03-04 NOTE — Telephone Encounter (Signed)
Patient is wanting to speak with Dr. Patsy Lager in regards to [facility] and would also like a  note to return to work on Tuesday August 16th. [phone]

## 2015-03-04 NOTE — Telephone Encounter (Signed)
Please advise 

## 2015-03-04 NOTE — Telephone Encounter (Signed)
Called him back- he would like to use FMLA to be out of work until next week.  He did see psychiatry last week, but did not want to take the medication that they rx for him (he does not remember that it was).  He is calling me asking for me to rx xanax which "I know works for me."  Advised him that while I am happy to try and help with his sx, I cannot take over his psychiatric care which is beyond my comfort level.  Encouraged him to consider using what his psychiatrist recommended.   Advised him that I am not going to rx xanax for him, but I am glad to see him in the office and will do his FMLA paperwork with plan for him to RTW on 8/16

## 2015-03-05 ENCOUNTER — Ambulatory Visit (INDEPENDENT_AMBULATORY_CARE_PROVIDER_SITE_OTHER): Payer: Managed Care, Other (non HMO) | Admitting: Family Medicine

## 2015-03-05 VITALS — BP 132/72 | HR 73 | Temp 98.0°F | Resp 16 | Ht 76.0 in | Wt 195.0 lb

## 2015-03-05 DIAGNOSIS — F309 Manic episode, unspecified: Secondary | ICD-10-CM
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
## 2015-03-05 NOTE — Patient Instructions (Signed)
Please do see the mental health provide of your choice for further care Your am looks a lot better Take care and I hope that all goes well for you

## 2015-03-11 ENCOUNTER — Telehealth: Payer: Self-pay | Admitting: Family Medicine

## 2015-03-11 NOTE — Telephone Encounter (Signed)
    Expand All Collapse All   Patient's mother states that her son is supposed to return to work tomorrow however he needs an extension on this. Mother states that son is not ready to go back to work. He has anxiety, is not sleeping, and his mother states that it is a day by day process. Please call mother to discuss this. She wants his updated work note sent to [phone] (fax).  [phone] Dondra Spry)

## 2015-03-11 NOTE — Telephone Encounter (Signed)
Called her back- will extend her son's work note for another 2 weeks.  They are trying to get him in to see another psychiatrist.  Advised her that I think this is a good idea, I am not able to manage his mental illness long term as it is beyond my training.  She states understanding and will get him in with psychiatry asap

## 2015-03-11 NOTE — Telephone Encounter (Signed)
Dr. Copland, please advise 

## 2015-03-11 NOTE — Telephone Encounter (Signed)
Patient's mother states that her son is supposed to return to work tomorrow however he needs an extension on this. Mother states that son is not ready to go back to work. He has anxiety, is not sleeping, and his mother states that it is a day by day process. Please call mother to discuss this. She wants his updated work note sent to [phone] (fax).  [phone] Dondra Spry)

## 2015-03-22 ENCOUNTER — Telehealth: Payer: Self-pay

## 2015-03-22 NOTE — Telephone Encounter (Signed)
PATIENT'S MOTHER (GAIL Prak) WOULD LIKE TO TALK WITH DR. Patsy Lager ABOUT HER SON. SHE SAID DR. COPLAND HAS BEEN HELPING HER WITH HIM REGARDING PROBLEMS HE IS HAVING WITH HIGH ANXIETY. HE HAS AN APPOINTMENT TO SEE DR. KAUR, BUT IT IS NOT UNTIL DEC. THEY DID PUT HIM ON A WAITING LIST. SHE WOULD LIKE DR. COPLAND TO CALL HER SO THAT SHE CAN DISCUSS SOME THINGS THAT Gilliam IS GOING THROUGH. DR. Patsy Lager ALSO GAVE HIM A NOTE TO RETURN TO WORK ON TUES., HOWEVER, HE WILL NOT BE ABLE TO. SHE SAID HE IS NOT READY.  BEST PHONE [phone] (MOTHER-GAIL Ruehl) OR [phone] (Kalil Goodroe. MBC

## 2015-03-24 NOTE — Telephone Encounter (Signed)
Called no answer and no VM °

## 2015-03-25 NOTE — Telephone Encounter (Signed)
Called and spoke to Curtis Nelson is still having a hard time, has not been sleeping well.  They are trying to get in with Dr.Kaur and will call again today In the meantime he is supposed to RTW this week and his mom does not think he is able, would like his note extended for 2 weeks.  They are trying to get STD as well.  Will do note and fax it to her

## 2015-04-04 ENCOUNTER — Telehealth: Payer: Self-pay

## 2015-04-04 NOTE — Telephone Encounter (Signed)
Curtis Nelson, have you or the billing staff received a request from [facility] for this patient?

## 2015-04-04 NOTE — Telephone Encounter (Signed)
Patient would like a note to Return to work on Sept 12th faxed to [phone] Attn: Dondra Spry

## 2015-04-04 NOTE — Telephone Encounter (Signed)
Dr Patsy Lager can we write note?

## 2015-04-04 NOTE — Telephone Encounter (Signed)
Joni Reining from [employer] left voicemail today at [phone] asking if we have received a request for updated clinical information. Her CB# is [phone] and her fax# is [phone].

## 2015-04-05 ENCOUNTER — Encounter: Payer: Self-pay | Admitting: Family Medicine

## 2015-04-05 NOTE — Telephone Encounter (Signed)
Taken care of- called Curtis Nelson to let her know but VM not set up

## 2015-07-19 ENCOUNTER — Ambulatory Visit (INDEPENDENT_AMBULATORY_CARE_PROVIDER_SITE_OTHER): Payer: Managed Care, Other (non HMO) | Admitting: Family Medicine

## 2015-07-19 VITALS — BP 132/80 | HR 94 | Temp 97.9°F | Resp 17 | Ht 75.5 in | Wt 181.0 lb

## 2015-07-19 DIAGNOSIS — R208 Other disturbances of skin sensation: Secondary | ICD-10-CM | POA: Diagnosis not present

## 2015-07-19 DIAGNOSIS — R630 Anorexia: Secondary | ICD-10-CM | POA: Diagnosis not present

## 2015-07-19 DIAGNOSIS — R002 Palpitations: Secondary | ICD-10-CM | POA: Diagnosis not present

## 2015-07-19 DIAGNOSIS — R5383 Other fatigue: Secondary | ICD-10-CM | POA: Diagnosis not present

## 2015-07-19 DIAGNOSIS — R200 Anesthesia of skin: Secondary | ICD-10-CM

## 2015-07-19 LAB — CBC
HCT: 43.6 % (ref 39.0–52.0)
Hemoglobin: 14.7 g/dL (ref 13.0–17.0)
MCH: 31.9 pg (ref 26.0–34.0)
MCHC: 33.7 g/dL (ref 30.0–36.0)
MCV: 94.6 fL (ref 78.0–100.0)
MPV: 10.6 fL (ref 8.6–12.4)
PLATELETS: 249 10*3/uL (ref 150–400)
RBC: 4.61 MIL/uL (ref 4.22–5.81)
RDW: 13.7 % (ref 11.5–15.5)
WBC: 5.4 10*3/uL (ref 4.0–10.5)

## 2015-07-19 LAB — COMPREHENSIVE METABOLIC PANEL
ALBUMIN: 5 g/dL (ref 3.6–5.1)
ALT: 24 U/L (ref 9–46)
AST: 22 U/L (ref 10–40)
Alkaline Phosphatase: 46 U/L (ref 40–115)
BUN: 13 mg/dL (ref 7–25)
CHLORIDE: 100 mmol/L (ref 98–110)
CO2: 27 mmol/L (ref 20–31)
CREATININE: 0.95 mg/dL (ref 0.60–1.35)
Calcium: 10.2 mg/dL (ref 8.6–10.3)
Glucose, Bld: 118 mg/dL — ABNORMAL HIGH (ref 65–99)
POTASSIUM: 4.5 mmol/L (ref 3.5–5.3)
SODIUM: 135 mmol/L (ref 135–146)
Total Bilirubin: 0.4 mg/dL (ref 0.2–1.2)
Total Protein: 7.2 g/dL (ref 6.1–8.1)

## 2015-07-19 LAB — T4, FREE: FREE T4: 1.67 ng/dL (ref 0.80–1.80)

## 2015-07-19 LAB — HIV ANTIBODY (ROUTINE TESTING W REFLEX): HIV: NONREACTIVE

## 2015-07-19 LAB — TSH: TSH: 0.636 u[IU]/mL (ref 0.350–4.500)

## 2015-07-19 LAB — RPR

## 2015-07-19 NOTE — Patient Instructions (Signed)
Your EKG did show an enlarged heart ventricle.  I am checking several different labs today and will follow up with these when they come back. I strongly encourage you to establish with a primary care doctor to better follow with these issues. That way you have someone who can refer you to specialists as needed.  I have given you the cards of 4 providers in our practice, I encourage you to contact our office and schedule with one of them.  If you start to feel worse please come back to see Curtis Nelson.

## 2015-07-19 NOTE — Progress Notes (Signed)
   Subjective:    Patient ID: Curtis Nelson, male    DOB: Nov 16, 1985, 29 y.o.   MRN: [id number]  Chief Complaint  Patient presents with  . Palpitations  . numbness all over  . Depression   Medications, allergies, past medical history, surgical history, family history, social history and problem list reviewed and updated.  HPI  [age] yom presents feeling "off" the past few days.  He cannot verbalize any specific complaints. Denies pain. Face has felt intermittently numb over past few days. When present it is the entire face. Has felt both low and high appetite past few days. Fatigued past few days but sleeping ok at night. Palpitations for several minutes this am while at work. No cp or sob. At first mentions dizziness then denies dizziness.   Of note he has zyprexa on his medication list. States he was prescribed this for a manic episode recently. States he only took it once but didn't like it. Has been on xanax in the past but not for several months. States he has never seen a psychiatrist and has never been formally diagnosed with bipolar depression or schizophrenia.   He does not not have one single worst complaint today when asked.    Review of Systems No fevers, chills, n/v, diarrhea.     Objective:   Physical Exam  Constitutional: He is oriented to person, place, and time. He appears well-developed and well-nourished.  Non-toxic appearance. He does not have a sickly appearance. He does not appear ill. No distress.  BP 132/80 mmHg  Pulse 94  Temp(Src) 97.9 F (36.6 C) (Oral)  Resp 17  Ht 6' 3.5" (1.918 m)  Wt 181 lb (82.101 kg)  BMI 22.32 kg/m2  SpO2 98%   Eyes: Conjunctivae and EOM are normal. Pupils are equal, round, and reactive to light.  Neck: Normal range of motion. No thyroid mass and no thyromegaly present.  Cardiovascular: Normal rate, regular rhythm and normal heart sounds.   Pulmonary/Chest: Effort normal and breath sounds normal. No tachypnea.  Neurological:  He is alert and oriented to person, place, and time. He has normal strength. No cranial nerve deficit or sensory deficit.  Able to feel touch on face bilaterally.   Psychiatric: He has a normal mood and affect. His speech is normal and behavior is normal.   EKG read by Dr Milus GlazierLauenstein: Findings: NSR with LVH, incomplete RBBB     Assessment & Plan:   Palpitations - Plan: EKG 12-Lead, TSH, T4, Free  Other fatigue - Plan: CBC, Comprehensive metabolic panel, HIV antibody, RPR  Appetite loss  Numbness of face --Unsure of etiology --EKG without acute concerns --lab work today, will f/u on results --exam benign today and pt appears stable, strongly encouraged to establish with pcp (given 4 cards of providers from our clinic) to have ongoing, regular appointments and for any referrals to specialists as needed  --pt and his mother are agreeable to this  Donnajean Lopesodd M. Charniece Venturino, PA-C Physician Assistant-Certified [facility]  07/19/2015 4:21 PM

## 2015-07-23 ENCOUNTER — Telehealth: Payer: Self-pay

## 2015-07-23 NOTE — Telephone Encounter (Signed)
Dondra SpryGail states her son need an oow schedule for Today and Tomorrow. Please call [phone] and she can pick it up or you can fax to [phone]

## 2015-07-23 NOTE — Telephone Encounter (Signed)
Faxed

## 2015-08-27 ENCOUNTER — Ambulatory Visit (INDEPENDENT_AMBULATORY_CARE_PROVIDER_SITE_OTHER): Payer: Managed Care, Other (non HMO) | Admitting: Physician Assistant

## 2015-08-27 VITALS — BP 124/78 | HR 86 | Temp 98.1°F | Resp 17 | Ht 76.5 in | Wt 188.0 lb

## 2015-08-27 DIAGNOSIS — L03012 Cellulitis of left finger: Secondary | ICD-10-CM | POA: Diagnosis not present

## 2015-08-27 DIAGNOSIS — L089 Local infection of the skin and subcutaneous tissue, unspecified: Secondary | ICD-10-CM | POA: Diagnosis not present

## 2015-08-27 MED ORDER — MUPIROCIN 2 % EX OINT: 1.0000 "application " | TOPICAL_OINTMENT | Freq: Two times a day (BID) | CUTANEOUS | Status: DC

## 2015-08-27 MED ORDER — DOXYCYCLINE HYCLATE 100 MG PO CAPS: 100.0000 mg | ORAL_CAPSULE | Freq: Two times a day (BID) | ORAL | Status: AC

## 2015-08-27 NOTE — Patient Instructions (Signed)
Please place heating pad 3 times per day for at least 15 minutes. Apply the mupirocin ointment to the finger twice per day.   Take the doxycycline twice per day.  Do not drink milk products or dairy while taking the medication. I would like you to wear a glove to work when handling contaminated products.  Keep a bandage over the open wound.  This can be removed at night.   I would like you to wear hand moisturizer with vaseline on top at night.     Cellulitis Cellulitis is an infection of the skin and the tissue beneath it. The infected area is usually red and tender. Cellulitis occurs most often in the arms and lower legs.  CAUSES  Cellulitis is caused by bacteria that enter the skin through cracks or cuts in the skin. The most common types of bacteria that cause cellulitis are staphylococci and streptococci. SIGNS AND SYMPTOMS   Redness and warmth.  Swelling.  Tenderness or pain.  Fever. DIAGNOSIS  Your health care provider can usually determine what is wrong based on a physical exam. Blood tests may also be done. TREATMENT  Treatment usually involves taking an antibiotic medicine. HOME CARE INSTRUCTIONS   Take your antibiotic medicine as directed by your health care provider. Finish the antibiotic even if you start to feel better.  Keep the infected arm or leg elevated to reduce swelling.  Apply a warm cloth to the affected area up to 4 times per day to relieve pain.  Take medicines only as directed by your health care provider.  Keep all follow-up visits as directed by your health care provider. SEEK MEDICAL CARE IF:   You notice red streaks coming from the infected area.  Your red area gets larger or turns dark in color.  Your bone or joint underneath the infected area becomes painful after the skin has healed.  Your infection returns in the same area or another area.  You notice a swollen bump in the infected area.  You develop new symptoms.  You have a  fever. SEEK IMMEDIATE MEDICAL CARE IF:   You feel very sleepy.  You develop vomiting or diarrhea.  You have a general ill feeling (malaise) with muscle aches and pains.   This information is not intended to replace advice given to you by your health care provider. Make sure you discuss any questions you have with your health care provider.   Document Released: 04/22/2005 Document Revised: 04/03/2015 Document Reviewed: 09/28/2011 Elsevier Interactive Patient Education [employer]

## 2015-08-27 NOTE — Progress Notes (Signed)
[facility] [address], [place] [address] [phone]  Date:  08/27/2015   Name:  Curtis Nelson   DOB:  Jan 04, 1986   MRN:  [id number]  PCP:  No primary care provider on file.    History of Present Illness:  Curtis Nelson is a 30 y.o. male patient who presents to [facility] for cc of left 4th finger mcp pain.   2 days ago, he was having tenderness and swelling of the 4th finger.  This is not his dominant .  This morning the swelling grew.  He was not able to bend the joint, it was so painful.  He soaked in hot water to help get it to bend.  There is no drainage, though he attempted to stick a pin through the swelling.  This has occurred at the right finger about 2 years ago.  Patient recalls that he has exceptional dryness of his hands in the winter, to have cracks at the knuckles.  These can bleed.  He denies fever.     There are no active problems to display for this patient.   No past medical history on file.  No past surgical history on file.  Social History  Substance Use Topics  . Smoking status: Current Every Day Smoker -- 2.00 packs/day for 16 years    Types: Cigarettes  . Smokeless tobacco: None  . Alcohol Use: 0.0 oz/week    0 Standard drinks or equivalent per week     Comment: rare    Family History  Problem Relation Age of Onset  . Heart disease Father     No Known Allergies  Medication list has been reviewed and updated.  No current outpatient prescriptions on file prior to visit.   No current facility-administered medications on file prior to visit.    ROS ROS otherwise unremarkable unless listed above.   Physical Examination: BP 124/78 mmHg  Pulse 86  Temp(Src) 98.1 F (36.7 C) (Oral)  Resp 17  Ht 6' 4.5" (1.943 m)  Wt 188 lb (85.276 kg)  BMI 22.59 kg/m2  SpO2 97% Ideal Body Weight: Weight in (lb) to have BMI = 25: 207.7  Physical Exam  Constitutional: He is oriented to person, place, and time. He appears  well-developed and well-nourished. No distress.  HENT:  Head: Atraumatic.  Right Ear: Tympanic membrane, external ear and ear canal normal.  Left Ear: Tympanic membrane, external ear and ear canal normal.  Nose: Mucosal edema and rhinorrhea present. Right sinus exhibits no maxillary sinus tenderness and no frontal sinus tenderness. Left sinus exhibits no maxillary sinus tenderness and no frontal sinus tenderness.  Mouth/Throat: No uvula swelling. No oropharyngeal exudate, posterior oropharyngeal edema or posterior oropharyngeal erythema.  Eyes: Conjunctivae, EOM and lids are normal. Pupils are equal, round, and reactive to light. Right eye exhibits normal extraocular motion. Left eye exhibits normal extraocular motion.  Neck: Trachea normal and full passive range of motion without pain. No edema and no erythema present.  Cardiovascular: Normal rate.   Pulmonary/Chest: Effort normal. No respiratory distress. He has no decreased breath sounds. He has no wheezes. He has no rhonchi.  Musculoskeletal:  Left 4th finger with swollen pip.  This is minimally tender to palpation.  rom is limited with flexion.  There is no palpable fluctuance but very indurated. Normal capillary refill.    Neurological: He is alert and oriented to person, place, and time.  Skin: Skin is warm and dry. He is not diaphoretic.  Psychiatric: He has a normal mood and affect. His behavior is normal.     Assessment and Plan: Curtis Nelson is a 30 y.o. male who is here today for cc of finger pain. Advised to use heat pads and not warm soaks, to avoid excessive dryness.  He will keep ointment, and wear waterproof gloves with working with contaminated and soiled materials.  He will return for alarming sxs discussed.   Finger infection - Plan: mupirocin ointment (BACTROBAN) 2 %, doxycycline (VIBRAMYCIN) 100 MG capsule  Cellulitis of finger of left hand - Plan: mupirocin ointment (BACTROBAN) 2 %, doxycycline (VIBRAMYCIN) 100 MG  capsule  Curtis Platt, PA-C [facility] 1/31/20179:28 PM

## 2015-10-21 ENCOUNTER — Ambulatory Visit (INDEPENDENT_AMBULATORY_CARE_PROVIDER_SITE_OTHER): Payer: Managed Care, Other (non HMO) | Admitting: Physician Assistant

## 2015-10-21 VITALS — BP 130/70 | HR 71 | Temp 98.9°F | Resp 20 | Ht 76.5 in | Wt 183.0 lb

## 2015-10-21 DIAGNOSIS — F309 Manic episode, unspecified: Secondary | ICD-10-CM | POA: Diagnosis not present

## 2015-10-21 DIAGNOSIS — F419 Anxiety disorder, unspecified: Secondary | ICD-10-CM | POA: Diagnosis not present

## 2015-10-21 DIAGNOSIS — F329 Major depressive disorder, single episode, unspecified: Secondary | ICD-10-CM | POA: Diagnosis not present

## 2015-10-21 DIAGNOSIS — I517 Cardiomegaly: Secondary | ICD-10-CM | POA: Diagnosis not present

## 2015-10-21 DIAGNOSIS — F1010 Alcohol abuse, uncomplicated: Secondary | ICD-10-CM | POA: Diagnosis not present

## 2015-10-21 DIAGNOSIS — F1310 Sedative, hypnotic or anxiolytic abuse, uncomplicated: Secondary | ICD-10-CM | POA: Insufficient documentation

## 2015-10-21 DIAGNOSIS — F32A Depression, unspecified: Secondary | ICD-10-CM

## 2015-10-21 NOTE — Patient Instructions (Addendum)
You will get a phone call about appt with cardiologist You will get a phone call about appt with psychiatry. Voice rest and hydration will help with your hoarseness. Return as needed.    IF you received an x-ray today, you will receive an invoice from [facility]. Please contact [facility] at [phone] with questions or concerns regarding your invoice.   IF you received labwork today, you will receive an invoice from [facility]. Please contact [facility] at [phone] with questions or concerns regarding your invoice.   Our billing staff will not be able to assist you with questions regarding bills from these companies.  You will be contacted with the lab results as soon as they are available. The fastest way to get your results is to activate your My Chart account. Instructions are located on the last page of this paperwork. If you have not heard from us regarding the results in 2 weeks, please contact this office.

## 2015-10-21 NOTE — Progress Notes (Signed)
[facility] [address], [place] [address] [phone]  Date:  10/21/2015   Name:  Curtis NevinJefferson Nelson   DOB:  1985-12-27   MRN:  [id number]  PCP:  No primary care provider on file.    Chief Complaint: Hoarse and Dizziness   History of Present Illness:  This is a 30 y.o. male who is presenting with complaints of anxiety. States been worse the past 3-4 months. States he is very stressed at work. 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 He will have episodes of heart racing, dizziness, left arm numbness. This is a repeated complaint over the past 1 year. States all of these episodes happen at work when he gets stressed, never happens at home. When they occur, he will leave, take 2 aspirin and take a walk. Goes away in 5-10 minutes. Pt was first seen for this issue 01/2015 by Dr. Patsy Lageropland. Was determined at that time to be having an acute manic episode with self-harm (burning himself with cigarettes). He was started on zyprexa but states that made him "feel crazy, I was pacing the room" so he stopped. He has never seen a psychiatrist. He has seen a therapist before but he states "they never let me talk". He endorses occ SI, fleeting, never has a plan. He does endorse getting very little sleep, 2-4 hours per night. He was here with the palpitations and left arm numbness 06/2015. LVH on EKG, no evidence of ischemia. Was not referred to cardiology.  Pt is trying to quit alcohol currently and states he thinks he is doing well with this. States before he was drinking 24 beers a day. He is now drinking 2-3 a night and then will skip a night. He mom stays with him and is helping him. He does not think he needs further help. Smokes marijuana occasionally. No other illicit drug use.  Pt states he gets xanax "from people". Has been taking since age 30. Takes for anxiety. States he does not take daily but could not give me a ballpark on how much he takes. Dosing is either 0.25 mg or 0.5 mg. He states  "whatever I can get". He states providers stopped prescribing for him a long time ago "because they said they were enabling me".  Review of Systems:  Review of Systems See HPI  There are no active problems to display for this patient.   Prior to Admission medications   Not on File    Not on File  History reviewed. No pertinent past surgical history.  Social History  Substance Use Topics  . Smoking status: Current Every Day Smoker -- 2.00 packs/day for 16 years    Types: Cigarettes  . Smokeless tobacco: None  . Alcohol Use: 0.0 oz/week    0 Standard drinks or equivalent per week     Comment: rare    Family History  Problem Relation Age of Onset  . Heart disease Father     Medication list has been reviewed and updated.  Physical Examination:  Physical Exam  Constitutional: He is oriented to person, place, and time. He appears well-developed and well-nourished. No distress.  HENT:  Head: Normocephalic and atraumatic.  Right Ear: Hearing normal.  Left Ear: Hearing normal.  Nose: Nose normal.  Mouth/Throat: Uvula is midline, oropharynx is clear and moist and mucous membranes are normal.  Eyes: Conjunctivae and lids are normal. Right eye exhibits no discharge. Left eye exhibits no discharge. No scleral icterus.  Neck: Carotid bruit is  not present.  Cardiovascular: Normal rate, regular rhythm, normal heart sounds and normal pulses.   No murmur heard. Pulmonary/Chest: Effort normal and breath sounds normal. No respiratory distress. He has no wheezes. He has no rhonchi. He has no rales.  Musculoskeletal: Normal range of motion.  Neurological: He is alert and oriented to person, place, and time. He has normal strength and normal reflexes. No cranial nerve deficit or sensory deficit. Gait normal.  Skin: Skin is warm, dry and intact. No lesion and no rash noted.  Psychiatric: He has a normal mood and affect. His speech is normal and behavior is normal. Thought content normal.    BP 130/70 mmHg  Pulse 71  Temp(Src) 98.9 F (37.2 C) (Oral)  Resp 20  Ht 6' 4.5" (1.943 m)  Wt 183 lb (83.008 kg)  BMI 21.99 kg/m2  SpO2 97%  Assessment and Plan:  1. LVH (left ventricular hypertrophy) LVH on EKG from 06/2015. Will refer to cardiology for further evaluation. - Ambulatory referral to Cardiology  2. Mania (HCC) 3. Alcohol abuse 4. Anxiety 5. Depression 6. Benzo abuse Pt with uncontrolled mental illness. Possible bipolar. Needs official diagnosis and management from psychiatry. Referred. I am not sure what to prescribe him from his anxiety since he is obviously a benzo abuser and will continue to take that for anxiety regardless. He feels he is doing well with weaning himself off alcohol at this time. The key will be getting him in with psychiatry and on the right meds. Return as needed. - Ambulatory referral to Psychiatry   Roswell Miners. Dyke Brackett, MHS [facility]  10/21/2015

## 2015-10-23 ENCOUNTER — Encounter: Payer: Self-pay | Admitting: Cardiology

## 2015-10-23 ENCOUNTER — Ambulatory Visit (INDEPENDENT_AMBULATORY_CARE_PROVIDER_SITE_OTHER): Payer: Managed Care, Other (non HMO) | Admitting: Cardiology

## 2015-10-23 VITALS — BP 120/76 | HR 82 | Ht 77.0 in | Wt 183.0 lb

## 2015-10-23 DIAGNOSIS — I517 Cardiomegaly: Secondary | ICD-10-CM

## 2015-10-23 DIAGNOSIS — R002 Palpitations: Secondary | ICD-10-CM | POA: Diagnosis not present

## 2015-10-23 NOTE — Progress Notes (Signed)
Cardiology Office Note   Date:  10/23/2015   ID:  [name], DOB 1985/10/06, MRN [id number]  PCP:  No PCP Per Patient  Cardiologist:   Evarose Altland Jorja Loa, MD    Chief Complaint  Patient presents with  . Chest Pain  . LVH     History of Present Illness: Curtis Nelson is a 30 y.o. male who presents today for cardiology event.evaluation.   He presents today for LVH on his ECG.  He has also had palpitations and left arm numbness which started in Dec 2016.  He was referred to psychology as well for anxiety, possible mania, depression, EtOH abuse, and benzo abuse.  He presents today for workup of his LVH on EKG , and palpitations. He says that he gets palpitations, mainly when he is upset and anxious. This happens most of the time at work. When he gets the palpitations, he also gets numbness and tingling in the left side of hs body. He says that he walks away from whatever is undergoing him and then goes away within 5 minutes. He was he mentioned hers also noted to have LVH on his EKG.   Today, he denies symptoms of palpitations, chest pain, shortness of breath, orthopnea, PND, lower extremity edema, claudication, dizziness, presyncope, syncope, bleeding, or neurologic sequela. The patient is tolerating medications without difficulties and is otherwise without complaint today.    Past Medical History  Diagnosis Date  . Hoarse   . Dizziness   . LVH (left ventricular hypertrophy)   . Mania (HCC)   . Alcohol abuse   . Anxiety   . Depression   . Benzodiazepine abuse   . Anxiety    Past Surgical History  Procedure Laterality Date  . No past surgeries       No current outpatient prescriptions on file.   No current facility-administered medications for this visit.    Allergies:   Review of patient's allergies indicates no known allergies.   Social History:  The patient  reports that he has been smoking Cigarettes.  He has a 32 pack-year smoking history. He does not  have any smokeless tobacco history on file. He reports that he drinks alcohol. He reports that he does not use illicit drugs.   Family History:  The patient's family history includes Heart disease in his father.    ROS:  Please see the history of present illness.   Otherwise, review of systems is positive for chest pain, fatigue, palpitations, depression, anxiety, dizziness.   All other systems are reviewed and negative.    PHYSICAL EXAM: VS:  BP 120/76 mmHg  Pulse 82  Ht  (1.956 m)  Wt 183 lb (83.008 kg)  BMI 21.70 kg/m2 , BMI Body mass index is 21.7 kg/(m^2). GEN: Well nourished, well developed, in no acute distress HEENT: normal Neck: no JVD, carotid bruits, or masses Cardiac: RRR; no murmurs, rubs, or gallops,no edema  Respiratory:  clear to auscultation bilaterally, normal work of breathing GI: soft, nontender, nondistended, + BS MS: no deformity or atrophy Skin: warm and dry Neuro:  Strength and sensation are intact Psych: euthymic mood, full affect  EKG:  EKG is ordered today. The ekg ordered today shows sinus rhythm, LVH, incomplete RBBB  Recent Labs: 07/19/2015: ALT 24; BUN 13; Creat 0.95; Hemoglobin 14.7; Platelets 249; Potassium 4.5; Sodium 135; TSH 0.636    Lipid Panel  No results found for: CHOL, TRIG, HDL, CHOLHDL, VLDL, LDLCALC, LDLDIRECT   Wt Readings from Last 3  Encounters:  10/23/15 183 lb (83.008 kg)  10/21/15 183 lb (83.008 kg)  08/27/15 188 lb (85.276 kg)       ASSESSMENT AND PLAN:  1.  LVH: significant LVH on his ECG with voltage criteria.  Ashritha Desrosiers get a TTE to better definie his anatomy.  2. Palpitoiants: likely caused by anxiety.  That being said, he is having some symptoms of a racing heart.  Sacheen Arrasmith therefore place a 30 day monitor to see if we can make a diagnosis.   he'll need arises in the evening  Current medicines are reviewed at length with the patient today.   The patient does not have concerns regarding his medicines.  The  following changes were made today:  none  Labs/ tests ordered today include:  Orders Placed This Encounter  Procedures  . Cardiac event monitor  . EKG 12-Lead  . Echocardiogram     Disposition:   FU with Cherine Drumgoole 6 weeks  Signed, Quinette Hentges Jorja LoaMartin Yolonda Purtle, MD  10/23/2015 2:49 PM     [facility] [address] [place] [address] [phone] (office) [phone] (fax)

## 2015-10-23 NOTE — Patient Instructions (Addendum)
Medication Instructions:  Your physician recommends that you continue on your current medications as directed. Please refer to the Current Medication list given to you today.  Labwork: None ordered  Testing/Procedures: Your physician has requested that you have an echocardiogram. Echocardiography is a painless test that uses sound waves to create images of your heart. It provides your doctor with information about the size and shape of your heart and how well your heart's chambers and valves are working. This procedure takes approximately one hour. There are no restrictions for this procedure.  Your physician has recommended that you wear an event monitor. Event monitors are medical devices that record the heart's electrical activity. Doctors most often [place] these monitors to diagnose arrhythmias. Arrhythmias are problems with the speed or rhythm of the heartbeat. The monitor is a small, portable device. You can wear one while you do your normal daily activities. This is usually used to diagnose what is causing palpitations/syncope (passing out).  Follow-Up: To be determined once physician has reviewed the above tests.  Thank you for choosing [facility]!!   Dory Horn, RN [phone]    Echocardiogram An echocardiogram, or echocardiography, uses sound waves (ultrasound) to produce an image of your heart. The echocardiogram is simple, painless, obtained within a short period of time, and offers valuable information to your health care provider. The images from an echocardiogram can provide information such as:  Evidence of coronary artery disease (CAD).  Heart size.  Heart muscle function.  Heart valve function.  Aneurysm detection.  Evidence of a past heart attack.  Fluid buildup around the heart.  Heart muscle thickening.  Assess heart valve function. LET [facility] CARE PROVIDER KNOW ABOUT:  Any allergies you have.  All medicines you are taking, including  vitamins, herbs, eye drops, creams, and over-the-counter medicines.  Previous problems you or members of your family have had with the use of anesthetics.  Any blood disorders you have.  Previous surgeries you have had.  Medical conditions you have.  Possibility of pregnancy, if this applies. BEFORE THE PROCEDURE  No special preparation is needed. Eat and drink normally.  PROCEDURE   In order to produce an image of your heart, gel will be applied to your chest and a wand-like tool (transducer) will be moved over your chest. The gel will help transmit the sound waves from the transducer. The sound waves will harmlessly bounce off your heart to allow the heart images to be captured in real-time motion. These images will then be recorded.  You may need an IV to receive a medicine that improves the quality of the pictures. AFTER THE PROCEDURE You may return to your normal schedule including diet, activities, and medicines, unless your health care provider tells you otherwise.   This information is not intended to replace advice given to you by your health care provider. Make sure you discuss any questions you have with your health care provider.   Document Released: 07/10/2000 Document Revised: 08/03/2014 Document Reviewed: 03/20/2013 Elsevier Interactive Patient Education 2016 Elsevier Inc.   Cardiac Event Monitoring A cardiac event monitor is a small recording device used to help detect abnormal heart rhythms (arrhythmias). The monitor is used to record heart rhythm when noticeable symptoms such as the following occur:  Fast heartbeats (palpitations), such as heart racing or fluttering.  Dizziness.  Fainting or light-headedness.  Unexplained weakness. The monitor is wired to two electrodes placed on your chest. Electrodes are flat, sticky disks that attach to your skin.  The monitor can be worn for up to 30 days. You will wear the monitor at all times, except when bathing.  HOW TO  USE YOUR CARDIAC EVENT MONITOR A technician will prepare your chest for the electrode placement. The technician will show you how to place the electrodes, how to work the monitor, and how to replace the batteries. Take time to practice using the monitor before you leave the office. Make sure you understand how to send the information from the monitor to your health care provider. This requires a telephone with a landline, not a cell phone. You need to:  Wear your monitor at all times, except when you are in water:  Do not get the monitor wet.  Take the monitor off when bathing. Do not swim or use a hot tub with it on.  Keep your skin clean. Do not put body lotion or moisturizer on your chest.  Change the electrodes daily or any time they stop sticking to your skin. You might need to use tape to keep them on.  It is possible that your skin under the electrodes could become irritated. To keep this from happening, try to put the electrodes in slightly different places on your chest. However, they must remain in the area under your left breast and in the upper right section of your chest.  Make sure the monitor is safely clipped to your clothing or in a location close to your body that your health care provider recommends.  Press the button to record when you feel symptoms of heart trouble, such as dizziness, weakness, light-headedness, palpitations, thumping, shortness of breath, unexplained weakness, or a fluttering or racing heart. The monitor is always on and records what happened slightly before you pressed the button, so do not worry about being too late to get good information.  Keep a diary of your activities, such as walking, doing chores, and taking medicine. It is especially important to note what you were doing when you pushed the button to record your symptoms. This will help your health care provider determine what might be contributing to your symptoms. The information stored in your  monitor will be reviewed by your health care provider alongside your diary entries.  Send the recorded information as recommended by your health care provider. It is important to understand that it will take some time for your health care provider to process the results.  Change the batteries as recommended by your health care provider. SEEK IMMEDIATE MEDICAL CARE IF:   You have chest pain.  You have extreme difficulty breathing or shortness of breath.  You develop a very fast heartbeat that persists.  You develop dizziness that does not go away.  You faint or constantly feel you are about to faint.   This information is not intended to replace advice given to you by your health care provider. Make sure you discuss any questions you have with your health care provider.   Document Released: 04/21/2008 Document Revised: 08/03/2014 Document Reviewed: 01/09/2013 Elsevier Interactive Patient Education [employer]2016 Elsevier Inc.

## 2015-10-24 ENCOUNTER — Encounter: Payer: Self-pay | Admitting: *Deleted

## 2015-10-24 NOTE — Progress Notes (Signed)
Patient ID: Curtis NevinJefferson Nelson, male   DOB: 10/23/1985, 30 y.o.   MRN: [id number] Patient did not show up for 10/24/15 12:00 PM appointment to have a 30 day cardiac event monitor applied.

## 2015-10-25 ENCOUNTER — Telehealth: Payer: Self-pay

## 2015-10-25 NOTE — Telephone Encounter (Signed)
The patient called to request a new work note.  He has several specialist doctor appointments scheduled for next week.  He also believes he caught the flu when he was seen here.  He would like the work note extended to 11/04/15.  He would like it faxed to [phone] for the HR Manager.  He also wants Lanier Clamicole Bush to give him a call to discuss matters related to his OV on 10/21/15.  CB#: [phone]

## 2015-10-29 ENCOUNTER — Telehealth: Payer: Self-pay

## 2015-10-29 NOTE — Telephone Encounter (Signed)
Note completed, faxed to HR manager. Received confirmation

## 2015-10-29 NOTE — Telephone Encounter (Signed)
I called pt -- he wants work note faxed to HR dept. I have printed work note, please fax.

## 2015-10-29 NOTE — Telephone Encounter (Signed)
The patient's mother called to request a work note to extend through this week.  He was seen on 10/21/15 by Lanier ClamNicole Bush, PA-C.  She requested it to be faxed to [phone].  CB#: [phone]

## 2015-10-30 ENCOUNTER — Encounter: Payer: Self-pay | Admitting: *Deleted

## 2015-10-30 NOTE — Progress Notes (Signed)
Patient ID: Curtis NevinJefferson Nelson, male   DOB: [birth year], 30 y.o.   MRN: [id number] Patient did not show up for 10/30/15, 11:30 AM, appointment to have a 30 day cardiac event monitor applied.

## 2015-10-30 NOTE — Telephone Encounter (Signed)
Ok for note 

## 2015-10-31 NOTE — Telephone Encounter (Signed)
I already wrote an extended work note and Revonda Standardllison should have faxed this weekend. Please see previous telephone encounters about this.

## 2015-10-31 NOTE — Progress Notes (Signed)
Patient ID: Curtis Nelson, male   DOB: 1985-12-08, 30 y.o.   MRN: [id number]   lmtcb to determine why he has no showed to last two appts for event monitor placement.

## 2015-11-01 ENCOUNTER — Telehealth: Payer: Self-pay

## 2015-11-01 NOTE — Telephone Encounter (Signed)
Patient needs disability forms completed by Lanier ClamNicole Bush for his last OV for treatment of LVH (left ventricular hypertrophy  I have completed what I could from the OV notes but somethings I am not sure about so I have highlighted the areas that need to be completed. I will place these forms in your box on 11/01/15 if you could return to the FMLA/Disability box at the 102 checkout desk within 5-7 business days. Thank you!

## 2015-11-04 NOTE — Telephone Encounter (Signed)
Yes, I put them in the FMLA box up front 3 days ago.

## 2015-11-04 NOTE — Telephone Encounter (Signed)
Have you turned these forms in because i did not see them in my box?

## 2015-11-04 NOTE — Telephone Encounter (Signed)
Paperwork is in your box waiting to be completed, not sure what paperwork was turned in but the one that I need completed is still in your box along with another patients FMLA forms. They are in the blue folder and the beige folder, if you could please fill out and return to the FMLA/Disability box when complete. Thank you!

## 2015-11-04 NOTE — Telephone Encounter (Signed)
I have not gotten them, I will have to reprint them and place them back in your box. I will try to have them there by the end of today (11/04/15) Sorry, not sure where they could have went.

## 2015-11-04 NOTE — Telephone Encounter (Signed)
Filled out on 11/02/15 and placed in FMLA box.

## 2015-11-05 NOTE — Telephone Encounter (Signed)
FMLA filled out and handed to Gonvickaitlin.

## 2015-11-06 ENCOUNTER — Telehealth: Payer: Self-pay

## 2015-11-06 NOTE — Telephone Encounter (Signed)
Please reference calls from 3/31 & 4/4. Pt's mom in wanting his RTW note extended 2 more weeks from today's date of 4/12. He has been seeing  Lanier ClamNicole Bush. Attention to the HR manager the fax #[phone]. Please advise at [phone]

## 2015-11-07 NOTE — Telephone Encounter (Signed)
I'm ok with that. Can you get some information from pt about his appts? Has he had an appt with psychiatrist yet? Thanks.

## 2015-11-07 NOTE — Telephone Encounter (Signed)
Paperwork scanned and faxed on 11/07/15

## 2015-11-08 ENCOUNTER — Other Ambulatory Visit (HOSPITAL_COMMUNITY): Payer: Managed Care, Other (non HMO)

## 2015-11-08 ENCOUNTER — Telehealth: Payer: Self-pay

## 2015-11-08 NOTE — Telephone Encounter (Signed)
Mom has not received the fax from Lanier Clamicole Bush yet regarding her son being out of work.   Please fax to her [phone]

## 2015-11-08 NOTE — Telephone Encounter (Signed)
Letter faxed.

## 2015-11-11 NOTE — Telephone Encounter (Signed)
Re-faxed.

## 2015-11-18 ENCOUNTER — Telehealth: Payer: Self-pay

## 2015-11-18 NOTE — Progress Notes (Signed)
Patient ID: Curtis Nelson, male   DOB: 07/08/1986, 30 y.o.   MRN: [id number] Left last message to call back. Patient never had echo nor monitor testing completed.  He no showed to appointments that had been made. Left detailed message asking him to call the office if he would like to have testing performed

## 2015-11-18 NOTE — Telephone Encounter (Signed)
[employer] needs Behavioral health statement to be completed by Lanier ClamNicole Bush I do not feel comfortable filling these forms out so I have highlighted the areas that need to be finished and I will place these in your box on 11/18/15. Please return to the FMLA/Disability box at the 102 checkout desk with in 5-7 business days. Thank you!

## 2015-11-23 NOTE — Telephone Encounter (Signed)
I have completed forms.  I spoke with pt yesterday to discuss his progress with the psychiatrist. He states he has not made appt yet... He states his new baby was born and he has been preoccupied. I voiced my frustration and confusion with the patient -- he asked for all this time off so that he could get his anxiety under control and make appts with specialists and he has not done this. I told him there was not much I could do with these forms... They want to know his progress in treatment and he has not done anything that we discussed. I filled out the forms the best I can but told him because he has not followed through, they have a right to deny his FMLA.

## 2015-12-12 ENCOUNTER — Ambulatory Visit (HOSPITAL_COMMUNITY): Payer: Managed Care, Other (non HMO) | Attending: Cardiology

## 2015-12-12 ENCOUNTER — Ambulatory Visit (INDEPENDENT_AMBULATORY_CARE_PROVIDER_SITE_OTHER): Payer: Managed Care, Other (non HMO)

## 2015-12-12 ENCOUNTER — Other Ambulatory Visit: Payer: Self-pay

## 2015-12-12 ENCOUNTER — Ambulatory Visit: Payer: Managed Care, Other (non HMO) | Admitting: Cardiology

## 2015-12-12 DIAGNOSIS — R002 Palpitations: Secondary | ICD-10-CM

## 2015-12-12 DIAGNOSIS — I340 Nonrheumatic mitral (valve) insufficiency: Secondary | ICD-10-CM | POA: Diagnosis not present

## 2015-12-12 DIAGNOSIS — I517 Cardiomegaly: Secondary | ICD-10-CM

## 2016-01-17 ENCOUNTER — Encounter: Payer: Self-pay | Admitting: *Deleted

## 2019-02-27 ENCOUNTER — Telehealth: Payer: Self-pay | Admitting: *Deleted

## 2019-02-27 NOTE — Telephone Encounter (Signed)
Entered in error

## 2021-07-27 DEATH — deceased
